# Patient Record
Sex: Female | Born: 1980
Health system: Southern US, Community
[De-identification: ages and names within clinical notes are randomized; demographics above are authoritative.]

## PROBLEM LIST (undated history)

## (undated) ENCOUNTER — Inpatient Hospital Stay (HOSPITAL_COMMUNITY): Payer: Self-pay

## (undated) DIAGNOSIS — J45909 Unspecified asthma, uncomplicated: Secondary | ICD-10-CM

## (undated) DIAGNOSIS — S72009A Fracture of unspecified part of neck of unspecified femur, initial encounter for closed fracture: Secondary | ICD-10-CM

## (undated) DIAGNOSIS — S2239XA Fracture of one rib, unspecified side, initial encounter for closed fracture: Secondary | ICD-10-CM

## (undated) DIAGNOSIS — S42309A Unspecified fracture of shaft of humerus, unspecified arm, initial encounter for closed fracture: Secondary | ICD-10-CM

## (undated) DIAGNOSIS — F909 Attention-deficit hyperactivity disorder, unspecified type: Secondary | ICD-10-CM

## (undated) DIAGNOSIS — R87629 Unspecified abnormal cytological findings in specimens from vagina: Secondary | ICD-10-CM

## (undated) DIAGNOSIS — O0930 Supervision of pregnancy with insufficient antenatal care, unspecified trimester: Secondary | ICD-10-CM

## (undated) DIAGNOSIS — S2249XA Multiple fractures of ribs, unspecified side, initial encounter for closed fracture: Secondary | ICD-10-CM

## (undated) HISTORY — PX: STYLOID PROCESS EXCISION: SHX5198

## (undated) HISTORY — DX: Unspecified fracture of shaft of humerus, unspecified arm, initial encounter for closed fracture: S42.309A

## (undated) HISTORY — DX: Supervision of pregnancy with insufficient antenatal care, unspecified trimester: O09.30

## (undated) HISTORY — DX: Unspecified abnormal cytological findings in specimens from vagina: R87.629

## (undated) HISTORY — PX: CHEST TUBE INSERTION: SHX231

---
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
## 2003-12-22 ENCOUNTER — Other Ambulatory Visit: Admission: RE | Admit: 2003-12-22 | Discharge: 2003-12-22 | Payer: Self-pay | Admitting: Obstetrics and Gynecology

## 2004-03-30 ENCOUNTER — Ambulatory Visit (HOSPITAL_COMMUNITY): Admission: RE | Admit: 2004-03-30 | Discharge: 2004-03-30 | Payer: Self-pay | Admitting: Obstetrics and Gynecology

## 2004-04-01 ENCOUNTER — Inpatient Hospital Stay (HOSPITAL_COMMUNITY): Admission: AC | Admit: 2004-04-01 | Discharge: 2004-04-14 | Payer: Self-pay

## 2004-07-05 ENCOUNTER — Inpatient Hospital Stay (HOSPITAL_COMMUNITY): Admission: AD | Admit: 2004-07-05 | Discharge: 2004-07-05 | Payer: Self-pay | Admitting: Obstetrics & Gynecology

## 2004-07-15 ENCOUNTER — Inpatient Hospital Stay (HOSPITAL_COMMUNITY): Admission: AD | Admit: 2004-07-15 | Discharge: 2004-07-15 | Payer: Self-pay | Admitting: Obstetrics and Gynecology

## 2004-08-19 ENCOUNTER — Inpatient Hospital Stay (HOSPITAL_COMMUNITY): Admission: AD | Admit: 2004-08-19 | Discharge: 2004-08-21 | Payer: Self-pay | Admitting: Obstetrics and Gynecology

## 2004-09-23 ENCOUNTER — Other Ambulatory Visit: Admission: RE | Admit: 2004-09-23 | Discharge: 2004-09-23 | Payer: Self-pay | Admitting: Obstetrics and Gynecology

## 2007-10-12 ENCOUNTER — Inpatient Hospital Stay (HOSPITAL_COMMUNITY): Admission: AD | Admit: 2007-10-12 | Discharge: 2007-10-12 | Payer: Self-pay | Admitting: Obstetrics and Gynecology

## 2008-03-27 ENCOUNTER — Inpatient Hospital Stay (HOSPITAL_COMMUNITY): Admission: AD | Admit: 2008-03-27 | Discharge: 2008-03-27 | Payer: Self-pay | Admitting: Obstetrics and Gynecology

## 2008-06-06 ENCOUNTER — Inpatient Hospital Stay (HOSPITAL_COMMUNITY): Admission: AD | Admit: 2008-06-06 | Discharge: 2008-06-08 | Payer: Self-pay | Admitting: Obstetrics and Gynecology

## 2009-01-19 ENCOUNTER — Emergency Department (HOSPITAL_COMMUNITY): Admission: EM | Admit: 2009-01-19 | Discharge: 2009-01-19 | Payer: Self-pay | Admitting: Emergency Medicine

## 2009-02-26 ENCOUNTER — Emergency Department (HOSPITAL_COMMUNITY): Admission: EM | Admit: 2009-02-26 | Discharge: 2009-02-26 | Payer: Self-pay | Admitting: Emergency Medicine

## 2009-09-13 ENCOUNTER — Emergency Department (HOSPITAL_BASED_OUTPATIENT_CLINIC_OR_DEPARTMENT_OTHER): Admission: EM | Admit: 2009-09-13 | Discharge: 2009-09-13 | Payer: Self-pay | Admitting: Emergency Medicine

## 2009-10-02 ENCOUNTER — Emergency Department (HOSPITAL_BASED_OUTPATIENT_CLINIC_OR_DEPARTMENT_OTHER): Admission: EM | Admit: 2009-10-02 | Discharge: 2009-10-03 | Payer: Self-pay | Admitting: Emergency Medicine
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
## 2011-07-09 ENCOUNTER — Emergency Department (INDEPENDENT_AMBULATORY_CARE_PROVIDER_SITE_OTHER): Payer: Self-pay

## 2011-07-09 ENCOUNTER — Emergency Department (HOSPITAL_BASED_OUTPATIENT_CLINIC_OR_DEPARTMENT_OTHER)
Admission: EM | Admit: 2011-07-09 | Discharge: 2011-07-09 | Disposition: A | Discharge status: Home / Self Care | Payer: Self-pay | Attending: Emergency Medicine | Admitting: Emergency Medicine

## 2011-07-09 ENCOUNTER — Encounter: Payer: Self-pay | Admitting: Emergency Medicine

## 2011-07-09 DIAGNOSIS — R0989 Other specified symptoms and signs involving the circulatory and respiratory systems: Secondary | ICD-10-CM | POA: Insufficient documentation

## 2011-07-09 DIAGNOSIS — IMO0001 Reserved for inherently not codable concepts without codable children: Secondary | ICD-10-CM

## 2011-07-09 DIAGNOSIS — R0609 Other forms of dyspnea: Secondary | ICD-10-CM | POA: Insufficient documentation

## 2011-07-09 DIAGNOSIS — R0602 Shortness of breath: Secondary | ICD-10-CM | POA: Insufficient documentation

## 2011-07-09 DIAGNOSIS — R0789 Other chest pain: Secondary | ICD-10-CM

## 2011-07-09 DIAGNOSIS — R05 Cough: Secondary | ICD-10-CM

## 2011-07-09 DIAGNOSIS — R059 Cough, unspecified: Secondary | ICD-10-CM

## 2011-07-09 NOTE — ED Notes (Signed)
Patient is resting comfortably. No needs voiced.

## 2011-07-09 NOTE — ED Provider Notes (Signed)
History     Chief Complaint  Patient presents with  . Chest Pain   Patient is a 30 y.o. female presenting with shortness of breath. The history is provided by the patient.  Shortness of Breath  The current episode started yesterday. The problem has been unchanged. The problem is mild. Associated symptoms include shortness of breath. Pertinent negatives include no fever, no sore throat, no cough and no wheezing. Associated symptoms comments: She complains of feeling as if she can't take a deep breath. No cough, fever. She feels this when she lies down, and denies having symptoms with exertion. Chest discomfort only when trying to take the deepest breath, otherwise, no chest pain. She quit smoking one month ago. Marland Kitchen She was not exposed to toxic fumes. She has not inhaled smoke recently. She has had no prior steroid use.    History reviewed. No pertinent past medical history.  Past Surgical History  Procedure Date  . Chest tube insertion     No family history on file.  History  Substance Use Topics  . Smoking status: Current Some Day Smoker  . Smokeless tobacco: Not on file  . Alcohol Use: Yes     occ    OB History    Grav Para Term Preterm Abortions TAB SAB Ect Mult Living                  Review of Systems  Constitutional: Negative for fever.  HENT: Negative.  Negative for sore throat.   Respiratory: Positive for shortness of breath. Negative for cough and wheezing.   Cardiovascular:       See HPI.  Musculoskeletal: Negative.   Neurological: Negative.     Physical Exam  BP 125/83  Pulse 83  Temp(Src) 98 F (36.7 C) (Oral)  Resp 20  SpO2 99%  LMP 06/25/2011  Physical Exam  Constitutional: She appears well-developed and well-nourished.  HENT:  Head: Normocephalic.  Neck: Normal range of motion. Neck supple.  Cardiovascular: Normal rate, regular rhythm and normal heart sounds.   Pulmonary/Chest: Effort normal and breath sounds normal.  Abdominal: Soft. Bowel  sounds are normal. There is no tenderness. There is no rebound and no guarding.  Musculoskeletal: Normal range of motion.  Neurological: She is alert. No cranial nerve deficit.  Skin: Skin is warm and dry. No rash noted.  Psychiatric: She has a normal mood and affect.    ED Course  Procedures Chest xray reviewed and is essentially normal. Discussed results with patient, who is comfortable with discharge.  MDM       Rodena Medin, PA 07/09/11 1758

## 2011-07-09 NOTE — ED Notes (Signed)
No PCP 

## 2011-09-04 NOTE — ED Provider Notes (Signed)
Medical screening examination/treatment/procedure(s) were performed by non-physician practitioner and as supervising physician I was immediately available for consultation/collaboration.  Doug Sou, MD 09/04/11 [phone]

## 2011-09-20 LAB — CBC
HCT: 33.2 — ABNORMAL LOW
Hemoglobin: 11.6 — ABNORMAL LOW
MCV: 85.7
Platelets: 218
RBC: 3.88
RDW: 13.9
WBC: 8.1

## 2011-09-20 LAB — RH IMMUNE GLOBULIN WORKUP (NOT WOMEN'S HOSP)

## 2011-09-20 LAB — RPR: RPR Ser Ql: NONREACTIVE

## 2011-09-22 LAB — RH IMMUNE GLOB WKUP(>/=20WKS)(NOT WOMEN'S HOSP)

## 2011-09-22 LAB — CBC
HCT: 30.8 — ABNORMAL LOW
Hemoglobin: 10.6 — ABNORMAL LOW
MCHC: 35.1
MCV: 84.6
Platelets: 180
RDW: 13.8
WBC: 10.5

## 2011-10-05 LAB — RH IMMUNE GLOBULIN WORKUP (NOT WOMEN'S HOSP)

## 2013-01-09 ENCOUNTER — Telehealth: Payer: Self-pay | Admitting: *Deleted

## 2013-01-09 NOTE — Telephone Encounter (Signed)
Pharmacy requesting refill on proair inhaler. Last fill on 11/28/11. Chart is at nurses station for review

## 2013-01-10 NOTE — Telephone Encounter (Signed)
We have not seen the patient in a year an OV is necessary.

## 2013-01-10 NOTE — Telephone Encounter (Signed)
Called patient to advise she will need office visit.

## 2016-06-19 DIAGNOSIS — J029 Acute pharyngitis, unspecified: Secondary | ICD-10-CM | POA: Diagnosis not present

## 2016-12-23 ENCOUNTER — Inpatient Hospital Stay (HOSPITAL_COMMUNITY): Payer: Medicaid Other

## 2016-12-23 ENCOUNTER — Inpatient Hospital Stay (HOSPITAL_COMMUNITY)
Admission: AD | Admit: 2016-12-23 | Discharge: 2016-12-23 | Disposition: A | Discharge status: Home / Self Care | Payer: Medicaid Other | Source: Ambulatory Visit | Attending: Obstetrics and Gynecology | Admitting: Obstetrics and Gynecology

## 2016-12-23 ENCOUNTER — Encounter (HOSPITAL_COMMUNITY): Payer: Self-pay | Admitting: *Deleted

## 2016-12-23 DIAGNOSIS — Z3A11 11 weeks gestation of pregnancy: Secondary | ICD-10-CM | POA: Diagnosis not present

## 2016-12-23 DIAGNOSIS — O208 Other hemorrhage in early pregnancy: Secondary | ICD-10-CM | POA: Diagnosis not present

## 2016-12-23 DIAGNOSIS — O99342 Other mental disorders complicating pregnancy, second trimester: Secondary | ICD-10-CM | POA: Diagnosis not present

## 2016-12-23 DIAGNOSIS — F17210 Nicotine dependence, cigarettes, uncomplicated: Secondary | ICD-10-CM | POA: Insufficient documentation

## 2016-12-23 DIAGNOSIS — O26892 Other specified pregnancy related conditions, second trimester: Secondary | ICD-10-CM | POA: Diagnosis not present

## 2016-12-23 DIAGNOSIS — N760 Acute vaginitis: Secondary | ICD-10-CM | POA: Diagnosis not present

## 2016-12-23 DIAGNOSIS — B9689 Other specified bacterial agents as the cause of diseases classified elsewhere: Secondary | ICD-10-CM

## 2016-12-23 DIAGNOSIS — O99332 Smoking (tobacco) complicating pregnancy, second trimester: Secondary | ICD-10-CM | POA: Diagnosis not present

## 2016-12-23 DIAGNOSIS — R109 Unspecified abdominal pain: Secondary | ICD-10-CM | POA: Insufficient documentation

## 2016-12-23 DIAGNOSIS — IMO0002 Reserved for concepts with insufficient information to code with codable children: Secondary | ICD-10-CM

## 2016-12-23 DIAGNOSIS — F909 Attention-deficit hyperactivity disorder, unspecified type: Secondary | ICD-10-CM | POA: Insufficient documentation

## 2016-12-23 HISTORY — DX: Fracture of one rib, unspecified side, initial encounter for closed fracture: S22.39XA

## 2016-12-23 HISTORY — DX: Fracture of unspecified part of neck of unspecified femur, initial encounter for closed fracture: S72.009A

## 2016-12-23 HISTORY — DX: Multiple fractures of ribs, unspecified side, initial encounter for closed fracture: S22.49XA

## 2016-12-23 HISTORY — DX: Attention-deficit hyperactivity disorder, unspecified type: F90.9

## 2016-12-23 LAB — WET PREP, GENITAL
SPERM: NONE SEEN
Trich, Wet Prep: NONE SEEN
Yeast Wet Prep HPF POC: NONE SEEN

## 2016-12-23 LAB — URINALYSIS, ROUTINE W REFLEX MICROSCOPIC
BILIRUBIN URINE: NEGATIVE
GLUCOSE, UA: NEGATIVE mg/dL
KETONES UR: NEGATIVE mg/dL
Nitrite: NEGATIVE
PH: 5 (ref 5.0–8.0)
PROTEIN: NEGATIVE mg/dL
Specific Gravity, Urine: 1.013 (ref 1.005–1.030)

## 2016-12-23 LAB — POCT PREGNANCY, URINE: Preg Test, Ur: POSITIVE — AB

## 2016-12-23 MED ORDER — METRONIDAZOLE 500 MG PO TABS: 500.0000 mg | ORAL_TABLET | Freq: Two times a day (BID) | ORAL | 0 refills | Status: DC

## 2016-12-23 NOTE — MAU Note (Signed)
Patient states she is [redacted] weeks pregnant and had a confirmation US [facility] around 7wks.  Has not been seen by OBGYN yet, but has appt for 01/04/16.  Presents today with lower abdominal cramping 5/10 and green foul smelling discharge.  Denies vaginal bleeding.

## 2016-12-23 NOTE — Progress Notes (Signed)
E Signature page not working in room 4.  Patient signed paper copy.

## 2016-12-23 NOTE — Discharge Instructions (Signed)
Bacterial Vaginosis Bacterial vaginosis is a vaginal infection that occurs when the normal balance of bacteria in the vagina is disrupted. It results from an overgrowth of certain bacteria. This is the most common vaginal infection among women ages 15-44. Because bacterial vaginosis increases your risk for STIs (sexually transmitted infections), getting treated can help reduce your risk for chlamydia, gonorrhea, herpes, and HIV (human immunodeficiency virus). Treatment is also important for preventing complications in pregnant women, because this condition can cause an early (premature) delivery. What are the causes? This condition is caused by an increase in harmful bacteria that are normally present in small amounts in the vagina. However, the reason that the condition develops is not fully understood. What increases the risk? The following factors may make you more likely to develop this condition:  Having a new sexual partner or multiple sexual partners.  Having unprotected sex.  Douching.  Having an intrauterine device (IUD).  Smoking.  Drug and alcohol abuse.  Taking certain antibiotic medicines.  Being pregnant.  You cannot get bacterial vaginosis from toilet seats, bedding, swimming pools, or contact with objects around you. What are the signs or symptoms? Symptoms of this condition include:  Grey or white vaginal discharge. The discharge can also be watery or foamy.  A fish-like odor with discharge, especially after sexual intercourse or during menstruation.  Itching in and around the vagina.  Burning or pain with urination.  Some women with bacterial vaginosis have no signs or symptoms. How is this diagnosed? This condition is diagnosed based on:  Your medical history.  A physical exam of the vagina.  Testing a sample of vaginal fluid under a microscope to look for a large amount of bad bacteria or abnormal cells. Your health care provider may use a cotton swab  or a small wooden spatula to collect the sample.  How is this treated? This condition is treated with antibiotics. These may be given as a pill, a vaginal cream, or a medicine that is put into the vagina (suppository). If the condition comes back after treatment, a second round of antibiotics may be needed. Follow these instructions at home: Medicines  Take over-the-counter and prescription medicines only as told by your health care provider.  Take or use your antibiotic as told by your health care provider. Do not stop taking or using the antibiotic even if you start to feel better. General instructions  If you have a female sexual partner, tell her that you have a vaginal infection. She should see her health care provider and be treated if she has symptoms. If you have a female sexual partner, he does not need treatment.  During treatment: ? Avoid sexual activity until you finish treatment. ? Do not douche. ? Avoid alcohol as directed by your health care provider. ? Avoid breastfeeding as directed by your health care provider.  Drink enough water and fluids to keep your urine clear or pale yellow.  Keep the area around your vagina and rectum clean. ? Wash the area daily with warm water. ? Wipe yourself from front to back after using the toilet.  Keep all follow-up visits as told by your health care provider. This is important. How is this prevented?  Do not douche.  Wash the outside of your vagina with warm water only.  Use protection when having sex. This includes latex condoms and dental dams.  Limit how many sexual partners you have. To help prevent bacterial vaginosis, it is best to have sex with just   one partner (monogamous).  Make sure you and your sexual partner are tested for STIs.  Wear cotton or cotton-lined underwear.  Avoid wearing tight pants and pantyhose, especially during summer.  Limit the amount of alcohol that you drink.  Do not use any products that  contain nicotine or tobacco, such as cigarettes and e-cigarettes. If you need help quitting, ask your health care provider.  Do not use illegal drugs. Where to find more information:  Centers for Disease Control and Prevention: www.cdc.gov/std  American Sexual Health Association (ASHA): www.ashastd.org  U.S. Department of Health and Human Services, Office on Women's Health: www.womenshealth.gov/ or https://www.womenshealth.gov/a-z-topics/bacterial-vaginosis Contact a health care provider if:  Your symptoms do not improve, even after treatment.  You have more discharge or pain when urinating.  You have a fever.  You have pain in your abdomen.  You have pain during sex.  You have vaginal bleeding between periods. Summary  Bacterial vaginosis is a vaginal infection that occurs when the normal balance of bacteria in the vagina is disrupted.  Because bacterial vaginosis increases your risk for STIs (sexually transmitted infections), getting treated can help reduce your risk for chlamydia, gonorrhea, herpes, and HIV (human immunodeficiency virus). Treatment is also important for preventing complications in pregnant women, because the condition can cause an early (premature) delivery.  This condition is treated with antibiotic medicines. These may be given as a pill, a vaginal cream, or a medicine that is put into the vagina (suppository). This information is not intended to replace advice given to you by your health care provider. Make sure you discuss any questions you have with your health care provider. Document Released: 12/12/2005 Document Revised: 08/27/2016 Document Reviewed: 08/27/2016 Elsevier Interactive Patient Education  2017 [employer]  

## 2016-12-23 NOTE — MAU Provider Note (Signed)
History     CSN: [id number]  Arrival date and time: 12/23/16 1603   First Provider Initiated Contact with Patient 12/23/16 1648      Chief Complaint  Patient presents with  . Abdominal Pain  . Vaginal Discharge   HPI Kim Freeman is a 35 y.o. G3P2002 at 68101w5d who presents with vaginal discharge & abdominal cramping. IUP confirmed by ultrasound at 7 weeks at the [facility]. [facility].  Current symptoms began 3 days ago. Reports intermittent lower abdominal cramping that she rates 5/10. Has not treated. Vaginal discharge varies in color & consistency; yellow/green & thin/thick. Malodorous. No vaginal irritation. Denies n/v/d, constipation, dysuria, vaginal bleeding, dyspareunia, or postcoital bleeding.   OB History    Gravida Para Term Preterm AB Living   3 2 2  0 0 2   SAB TAB Ectopic Multiple Live Births   0 0 0 0 2      Past Medical History:  Diagnosis Date  . ADHD   . Broken ribs   . Fracture, hip [facility](HCC)     Past Surgical History:  Procedure Laterality Date  . CHEST TUBE INSERTION    . NO PAST SURGERIES      History reviewed. No pertinent family history.  Social History  Substance Use Topics  . Smoking status: Current Some Day Smoker    Packs/day: 0.25  . Smokeless tobacco: Never Used  . Alcohol use No     Comment: occ    Allergies: No Known Allergies  Prescriptions Prior to Admission  Medication Sig Dispense Refill Last Dose  . amphetamine-dextroamphetamine (ADDERALL) 30 MG tablet Take 20-30 mg by mouth daily.   12/23/2016 at Unknown time    Review of Systems  Constitutional: Negative.   Gastrointestinal: Positive for abdominal pain. Negative for constipation, nausea and vomiting.  Genitourinary: Negative for dysuria.       + vaginal discharge No vaginal bleeding   Physical Exam   Blood pressure 131/81, pulse 101, temperature 97.9 F (36.6 C), temperature source Oral, resp. rate 18, height 5'  3" (1.6 m), weight 184 lb 6.4 oz (83.6 kg), last menstrual period 10/02/2016, SpO2 97 %.  Physical Exam  Nursing note and vitals reviewed. Constitutional: She is oriented to person, place, and time. She appears well-developed and well-nourished. No distress.  HENT:  Head: Normocephalic and atraumatic.  Eyes: Conjunctivae are normal. Right eye exhibits no discharge. Left eye exhibits no discharge. No scleral icterus.  Neck: Normal range of motion.  Cardiovascular: Normal rate, regular rhythm and normal heart sounds.   No murmur heard. Respiratory: Effort normal and breath sounds normal. No respiratory distress. She has no wheezes.  GI: Soft. Bowel sounds are normal. She exhibits no distension. There is no tenderness. There is no rebound and no guarding.  Genitourinary: Uterus is enlarged. Cervix exhibits discharge (small amount of clear frothy discharge). Cervix exhibits no motion tenderness and no friability. Right adnexum displays no mass and no tenderness. Left adnexum displays no mass and no tenderness. No bleeding in the vagina. Vaginal discharge found.  Neurological: She is alert and oriented to person, place, and time.  Skin: Skin is warm and dry. She is not diaphoretic.  Psychiatric: She has a normal mood and affect. Her behavior is normal. Judgment and thought content normal.    MAU Course  Procedures Results for orders placed or performed during the hospital encounter of 12/23/16 (from the past 24 hour(s))  Urinalysis, Routine  w reflex microscopic     Status: Abnormal   Collection Time: 12/23/16  4:26 PM  Result Value Ref Range   Color, Urine YELLOW YELLOW   APPearance CLOUDY (A) CLEAR   Specific Gravity, Urine 1.013 1.005 - 1.030   pH 5.0 5.0 - 8.0   Glucose, UA NEGATIVE NEGATIVE mg/dL   Hgb urine dipstick SMALL (A) NEGATIVE   Bilirubin Urine NEGATIVE NEGATIVE   Ketones, ur NEGATIVE NEGATIVE mg/dL   Protein, ur NEGATIVE NEGATIVE mg/dL   Nitrite NEGATIVE NEGATIVE    Leukocytes, UA LARGE (A) NEGATIVE   RBC / HPF 6-30 0 - 5 RBC/hpf   WBC, UA 6-30 0 - 5 WBC/hpf   Bacteria, UA RARE (A) NONE SEEN   Squamous Epithelial / LPF 6-30 (A) NONE SEEN   Mucous PRESENT   Pregnancy, urine POC     Status: Abnormal   Collection Time: 12/23/16  4:28 PM  Result Value Ref Range   Preg Test, Ur POSITIVE (A) NEGATIVE  Wet prep, genital     Status: Abnormal   Collection Time: 12/23/16  5:03 PM  Result Value Ref Range   Yeast Wet Prep HPF POC NONE SEEN NONE SEEN   Trich, Wet Prep NONE SEEN NONE SEEN   Clue Cells Wet Prep HPF POC PRESENT (A) NONE SEEN   WBC, Wet Prep HPF POC MANY (A) NONE SEEN   Sperm NONE SEEN    [place] Ob Comp Less 14 Wks  Result Date: 12/23/2016 CLINICAL DATA:  Fetal heart tones not heard.  Initial encounter. EXAM: OBSTETRIC <14 WK ULTRASOUND TECHNIQUE: Transabdominal ultrasound was performed for evaluation of the gestation as well as the maternal uterus and adnexal regions. COMPARISON:  Pelvic ultrasound performed 07/15/2004 FINDINGS: Intrauterine gestational sac: Single; visualized and normal in shape. Yolk sac:  No Embryo:  Yes Cardiac Activity: Yes Heart Rate: 170 bpm CRL:   50.5  mm   11 w 5 d                  US EDC: 07/09/2017 Subchorionic hemorrhage: A small amount of subchorionic hemorrhage is noted. Maternal uterus/adnexae: The uterus is otherwise unremarkable. The right ovary is unremarkable in appearance, measuring 3.6 x 2.2 x 2.6 cm. The left ovary is not visualized due to overlying bowel. There is no evidence for ovarian torsion. No free fluid is seen within the pelvic cul-de-sac. IMPRESSION: 1. Single live intrauterine pregnancy noted, with a crown-rump length of 5.1 cm, corresponding to a gestational age of [redacted] weeks 5 days. This reflects an estimated date of delivery of July 09, 2017. 2. Small amount of subchorionic hemorrhage noted. Electronically Signed   By: Roanna RaiderJeffery  Chang M.D.   On: 12/23/2016 18:08    MDM Unable to doppler FHTs -- ultrasound  ordered GC/CT & wet prep Ultrasound shows SIUP at 4033w5d with cardiac activity  Assessment and Plan  A: 1. BV (bacterial vaginosis)   2. Fetal heart tones not heard    P; Discharge home Rx flagyl GC/CT & urine culture pending Start prenatal care Discussed reasons to return to MAU  Judeth HornErin Shauntell Iglesia 12/23/2016, 4:48 PM

## 2016-12-24 LAB — CULTURE, OB URINE

## 2016-12-26 NOTE — L&D Delivery Note (Signed)
Delivery Note At 10:32 PM a viable female was delivered via Vaginal, Spontaneous Delivery (Presentation: OA;  ).  APGAR: 8, 9; weight pending .   Placenta status:delivered intact, shultz , .  Cord: 3vc  with the following complications:loose nuchal x 1 .  Cord pH: none  Anesthesia: Epidural  Episiotomy: None Lacerations:  none Suture Repair: n/a Est. Blood Loss (mL):  100ml  Mom to postpartum.  Baby to Couplet care / Skin to Skin  Pt desires postpartum tubal.  Consent signed 05/04/17  Kim Freeman 07/03/2017, 10:46 PM

## 2016-12-27 LAB — GC/CHLAMYDIA PROBE AMP (~~LOC~~) NOT AT ARMC
Chlamydia: NEGATIVE
Neisseria Gonorrhea: NEGATIVE

## 2016-12-29 ENCOUNTER — Telehealth: Payer: Self-pay

## 2016-12-29 NOTE — Telephone Encounter (Signed)
Pt called and stated that she was returning our call.  Called pt and informed her of her nornal STD screening and that at this time her urine shows nothing to treat.  Pt stated understanding with no further questions.

## 2017-02-02 ENCOUNTER — Inpatient Hospital Stay (HOSPITAL_COMMUNITY)
Admission: AD | Admit: 2017-02-02 | Discharge: 2017-02-02 | Disposition: A | Discharge status: Home / Self Care | Payer: Medicaid Other | Source: Ambulatory Visit | Attending: Obstetrics and Gynecology | Admitting: Obstetrics and Gynecology

## 2017-02-02 ENCOUNTER — Encounter (HOSPITAL_COMMUNITY): Payer: Self-pay | Admitting: *Deleted

## 2017-02-02 DIAGNOSIS — O368120 Decreased fetal movements, second trimester, not applicable or unspecified: Secondary | ICD-10-CM | POA: Diagnosis not present

## 2017-02-02 DIAGNOSIS — Z3A17 17 weeks gestation of pregnancy: Secondary | ICD-10-CM | POA: Diagnosis not present

## 2017-02-02 DIAGNOSIS — O368121 Decreased fetal movements, second trimester, fetus 1: Secondary | ICD-10-CM | POA: Diagnosis not present

## 2017-02-02 LAB — URINALYSIS, ROUTINE W REFLEX MICROSCOPIC
Bilirubin Urine: NEGATIVE
GLUCOSE, UA: NEGATIVE mg/dL
HGB URINE DIPSTICK: NEGATIVE
Ketones, ur: NEGATIVE mg/dL
NITRITE: NEGATIVE
PH: 7 (ref 5.0–8.0)
PROTEIN: NEGATIVE mg/dL
Specific Gravity, Urine: 1.023 (ref 1.005–1.030)

## 2017-02-02 MED ORDER — CONCEPT OB 130-92.4-1 MG PO CAPS: 1.0000 | ORAL_CAPSULE | Freq: Every day | ORAL | 12 refills | Status: DC

## 2017-02-02 NOTE — MAU Note (Signed)
States that she had been feeling fetal movement but not today; has had one prenatal visit; had originally been planning termination;

## 2017-02-02 NOTE — Discharge Instructions (Signed)

## 2017-02-02 NOTE — MAU Provider Note (Signed)
Chief Complaint  Patient presents with  . Decreased Fetal Movement     First Provider Initiated Contact with Patient 02/02/17 1155      S: Kim SkeeterShawn M Freeman  is a 36 y.o. y.o. year old 403P2002 female at 6883w4d weeks gestation who presents to MAU reporting decreased fetal movement since today. Has previously been feeling fetal mvmt daily. Pt is also very emotional because she had been planning termination and changed hr mind. Is worried that she has not gotten prenatal care or genetic screening.    Contractions: None Vaginal bleeding: None Leaking of fluid: None   O: Patient Vitals for the past 24 hrs:  BP Temp Temp src Pulse Resp  02/02/17 1144 127/82 98.1 F (36.7 C) Oral 96 18   General: NAD Heart: Regular rate Lungs: Normal rate and effort Abd: Soft, NT, Gravid, S=D Pelvic: Deferred   FHR 154 by doppler.  Informal BS US shows active fetus w/ grossly nml fluid, pos cardiac activity and BPB 19.0 weeks.   A: 783w4d week IUP Decreased fetal movement w/ Nml fetal mvmt and FHR.   P: Discharge home in stable condition per consult w/ Huel CoteKathy Richardson, MD. Preterm labor precautions and fetal kick counts. Follow-up in 1 week as scheduled for prenatal visit or sooner as needed if symptoms worsen. Return to maternity admissions as needed if symptoms worsen. Ask about quad screen at [place]. Rx PNV.   [place] Le[place] Folds, CNM 02/02/2017 12:40 PM  2

## 2017-02-20 DIAGNOSIS — Z1151 Encounter for screening for human papillomavirus (HPV): Secondary | ICD-10-CM | POA: Diagnosis not present

## 2017-02-20 DIAGNOSIS — O0932 Supervision of pregnancy with insufficient antenatal care, second trimester: Secondary | ICD-10-CM | POA: Diagnosis not present

## 2017-02-20 DIAGNOSIS — Z3A20 20 weeks gestation of pregnancy: Secondary | ICD-10-CM | POA: Diagnosis not present

## 2017-02-20 DIAGNOSIS — Z124 Encounter for screening for malignant neoplasm of cervix: Secondary | ICD-10-CM | POA: Diagnosis not present

## 2017-02-20 DIAGNOSIS — O99332 Smoking (tobacco) complicating pregnancy, second trimester: Secondary | ICD-10-CM | POA: Diagnosis not present

## 2017-02-20 DIAGNOSIS — Z363 Encounter for antenatal screening for malformations: Secondary | ICD-10-CM | POA: Diagnosis not present

## 2017-02-20 DIAGNOSIS — R87612 Low grade squamous intraepithelial lesion on cytologic smear of cervix (LGSIL): Secondary | ICD-10-CM | POA: Diagnosis not present

## 2017-02-20 DIAGNOSIS — Z3689 Encounter for other specified antenatal screening: Secondary | ICD-10-CM | POA: Diagnosis not present

## 2017-02-20 LAB — OB RESULTS CONSOLE ABO/RH: RH Type: NEGATIVE

## 2017-02-20 LAB — OB RESULTS CONSOLE HEPATITIS B SURFACE ANTIGEN: HEP B S AG: NEGATIVE

## 2017-02-20 LAB — OB RESULTS CONSOLE ANTIBODY SCREEN: Antibody Screen: NEGATIVE

## 2017-02-20 LAB — OB RESULTS CONSOLE GC/CHLAMYDIA
CHLAMYDIA, DNA PROBE: NEGATIVE
Gonorrhea: NEGATIVE

## 2017-02-20 LAB — OB RESULTS CONSOLE RPR: RPR: NONREACTIVE

## 2017-02-20 LAB — OB RESULTS CONSOLE HIV ANTIBODY (ROUTINE TESTING): HIV: NONREACTIVE

## 2017-02-20 LAB — OB RESULTS CONSOLE RUBELLA ANTIBODY, IGM: RUBELLA: IMMUNE

## 2017-03-20 DIAGNOSIS — R87612 Low grade squamous intraepithelial lesion on cytologic smear of cervix (LGSIL): Secondary | ICD-10-CM | POA: Diagnosis not present

## 2017-03-20 DIAGNOSIS — O0932 Supervision of pregnancy with insufficient antenatal care, second trimester: Secondary | ICD-10-CM | POA: Diagnosis not present

## 2017-03-20 DIAGNOSIS — Z3A24 24 weeks gestation of pregnancy: Secondary | ICD-10-CM | POA: Diagnosis not present

## 2017-03-20 DIAGNOSIS — N888 Other specified noninflammatory disorders of cervix uteri: Secondary | ICD-10-CM | POA: Diagnosis not present

## 2017-04-17 DIAGNOSIS — Z3689 Encounter for other specified antenatal screening: Secondary | ICD-10-CM | POA: Diagnosis not present

## 2017-04-17 DIAGNOSIS — O0932 Supervision of pregnancy with insufficient antenatal care, second trimester: Secondary | ICD-10-CM | POA: Diagnosis not present

## 2017-04-17 DIAGNOSIS — O99332 Smoking (tobacco) complicating pregnancy, second trimester: Secondary | ICD-10-CM | POA: Diagnosis not present

## 2017-04-17 DIAGNOSIS — Z3A28 28 weeks gestation of pregnancy: Secondary | ICD-10-CM | POA: Diagnosis not present

## 2017-04-17 DIAGNOSIS — Z3483 Encounter for supervision of other normal pregnancy, third trimester: Secondary | ICD-10-CM | POA: Diagnosis not present

## 2017-04-17 DIAGNOSIS — O360130 Maternal care for anti-D [Rh] antibodies, third trimester, not applicable or unspecified: Secondary | ICD-10-CM | POA: Diagnosis not present

## 2017-04-17 DIAGNOSIS — Z6791 Unspecified blood type, Rh negative: Secondary | ICD-10-CM | POA: Diagnosis not present

## 2017-05-23 DIAGNOSIS — O358XX0 Maternal care for other (suspected) fetal abnormality and damage, not applicable or unspecified: Secondary | ICD-10-CM | POA: Diagnosis not present

## 2017-05-23 DIAGNOSIS — Z3A33 33 weeks gestation of pregnancy: Secondary | ICD-10-CM | POA: Diagnosis not present

## 2017-05-29 ENCOUNTER — Encounter (HOSPITAL_COMMUNITY): Payer: Self-pay

## 2017-05-29 ENCOUNTER — Inpatient Hospital Stay (HOSPITAL_COMMUNITY)
Admission: AD | Admit: 2017-05-29 | Discharge: 2017-05-29 | Disposition: A | Discharge status: Home / Self Care | Payer: BLUE CROSS/BLUE SHIELD | Source: Ambulatory Visit | Attending: Obstetrics and Gynecology | Admitting: Obstetrics and Gynecology

## 2017-05-29 DIAGNOSIS — O42913 Preterm premature rupture of membranes, unspecified as to length of time between rupture and onset of labor, third trimester: Secondary | ICD-10-CM | POA: Diagnosis not present

## 2017-05-29 DIAGNOSIS — O479 False labor, unspecified: Secondary | ICD-10-CM | POA: Diagnosis not present

## 2017-05-29 DIAGNOSIS — Z3A34 34 weeks gestation of pregnancy: Secondary | ICD-10-CM | POA: Insufficient documentation

## 2017-05-29 DIAGNOSIS — Z3493 Encounter for supervision of normal pregnancy, unspecified, third trimester: Secondary | ICD-10-CM | POA: Insufficient documentation

## 2017-05-29 DIAGNOSIS — O26893 Other specified pregnancy related conditions, third trimester: Secondary | ICD-10-CM

## 2017-05-29 DIAGNOSIS — N898 Other specified noninflammatory disorders of vagina: Secondary | ICD-10-CM

## 2017-05-29 LAB — URINALYSIS, ROUTINE W REFLEX MICROSCOPIC
BILIRUBIN URINE: NEGATIVE
Glucose, UA: NEGATIVE mg/dL
HGB URINE DIPSTICK: NEGATIVE
KETONES UR: NEGATIVE mg/dL
NITRITE: NEGATIVE
PROTEIN: 100 mg/dL — AB
Specific Gravity, Urine: 1.025 (ref 1.005–1.030)
pH: 8 (ref 5.0–8.0)

## 2017-05-29 LAB — POCT FERN TEST

## 2017-05-29 NOTE — Progress Notes (Addendum)
G3P2@ 34.[redacted] wksga. Presents to triage for R/O SROM. States started leaking two days ago and cont to leak. No ctx or bleeding.   1600: Provider at bs  Assessing. Fern test cone. No pooling of fluid on speculum exam  SVE 1/60/-3  Fern test negative.   1635: Dr. Ellyn HackBovard notified. Orders received to discharge pt home.   Discharge instructions given with pt understanding. Pt left unit via ambulatory.

## 2017-05-29 NOTE — Progress Notes (Signed)
Urine in lab 

## 2017-05-29 NOTE — Discharge Instructions (Signed)

## 2017-05-29 NOTE — MAU Provider Note (Signed)
S: Ms. Kim Freeman is a 36 y.o. G3P2002 at 5537w1d  who presents to MAU today complaining of leaking of fluid since 2 days ago, and felt like her underwear was wet after laying down and then sitting up today. She denies vaginal bleeding. She denies contractions. She reports normal fetal movement.    O: BP 120/68   Pulse 80   Temp 98 F (36.7 C) (Oral)   Resp 18   LMP 10/02/2016 (Exact Date)   SpO2 99%  GENERAL: Well-developed, well-nourished female in no acute distress.  HEAD: Normocephalic, atraumatic.  CHEST: Normal effort of breathing, regular heart rate ABDOMEN: Soft, nontender, gravid PELVIC: Normal external female genitalia. Vagina is pink and rugated. Cervix with normal contour, no lesions. Normal physiologic white discharge.  NO pooling.   Cervical exam: 1cm/60%/posterior/-3  Bag intact on exam.   Fetal Monitoring: I personally reviewed the patient's NST today, found to be REACTIVE. 135 bpm, mod var, +accels, no decels. CTX: None.   Results for orders placed or performed during the hospital encounter of 05/29/17 (from the past 24 hour(s))  Urinalysis, Routine w reflex microscopic     Status: Abnormal   Collection Time: 05/29/17  3:25 PM  Result Value Ref Range   Color, Urine YELLOW YELLOW   APPearance HAZY (A) CLEAR   Specific Gravity, Urine 1.025 1.005 - 1.030   pH 8.0 5.0 - 8.0   Glucose, UA NEGATIVE NEGATIVE mg/dL   Hgb urine dipstick NEGATIVE NEGATIVE   Bilirubin Urine NEGATIVE NEGATIVE   Ketones, ur NEGATIVE NEGATIVE mg/dL   Protein, ur 161100 (A) NEGATIVE mg/dL   Nitrite NEGATIVE NEGATIVE   Leukocytes, UA SMALL (A) NEGATIVE   RBC / HPF 0-5 0 - 5 RBC/hpf   WBC, UA 0-5 0 - 5 WBC/hpf   Bacteria, UA MANY (A) NONE SEEN   Squamous Epithelial / LPF 0-5 (A) NONE SEEN   Mucous PRESENT   Fern Test     Status: Normal   Collection Time: 05/29/17  4:30 PM  Result Value Ref Range   POCT Fern Test       A: SIUP at 6037w1d  Membranes intact  Reactive NST  P: Report  given to RN to contact MD on call for further instructions Dr. Ellyn HackBovard OK with discharge.  Kim Freeman, Kim Freeman [place], [place]  [place] 05/29/2017 4:47 PM

## 2017-05-30 LAB — CULTURE, OB URINE

## 2017-06-06 DIAGNOSIS — Z3685 Encounter for antenatal screening for Streptococcus B: Secondary | ICD-10-CM | POA: Diagnosis not present

## 2017-06-06 DIAGNOSIS — Z3A35 35 weeks gestation of pregnancy: Secondary | ICD-10-CM | POA: Diagnosis not present

## 2017-06-06 DIAGNOSIS — O0933 Supervision of pregnancy with insufficient antenatal care, third trimester: Secondary | ICD-10-CM | POA: Diagnosis not present

## 2017-06-07 LAB — OB RESULTS CONSOLE GBS: GBS: NEGATIVE

## 2017-06-12 ENCOUNTER — Inpatient Hospital Stay (HOSPITAL_COMMUNITY)
Admission: AD | Admit: 2017-06-12 | Discharge: 2017-06-12 | Disposition: A | Discharge status: Home / Self Care | Payer: BLUE CROSS/BLUE SHIELD | Source: Ambulatory Visit | Attending: Obstetrics and Gynecology | Admitting: Obstetrics and Gynecology

## 2017-06-12 ENCOUNTER — Encounter (HOSPITAL_COMMUNITY): Payer: Self-pay

## 2017-06-12 DIAGNOSIS — Z711 Person with feared health complaint in whom no diagnosis is made: Secondary | ICD-10-CM | POA: Insufficient documentation

## 2017-06-12 DIAGNOSIS — O99343 Other mental disorders complicating pregnancy, third trimester: Secondary | ICD-10-CM | POA: Diagnosis not present

## 2017-06-12 DIAGNOSIS — O09893 Supervision of other high risk pregnancies, third trimester: Secondary | ICD-10-CM | POA: Diagnosis not present

## 2017-06-12 DIAGNOSIS — F17210 Nicotine dependence, cigarettes, uncomplicated: Secondary | ICD-10-CM | POA: Diagnosis not present

## 2017-06-12 DIAGNOSIS — O9989 Other specified diseases and conditions complicating pregnancy, childbirth and the puerperium: Secondary | ICD-10-CM | POA: Diagnosis not present

## 2017-06-12 DIAGNOSIS — Z3A36 36 weeks gestation of pregnancy: Secondary | ICD-10-CM | POA: Insufficient documentation

## 2017-06-12 DIAGNOSIS — O99333 Smoking (tobacco) complicating pregnancy, third trimester: Secondary | ICD-10-CM | POA: Insufficient documentation

## 2017-06-12 DIAGNOSIS — F909 Attention-deficit hyperactivity disorder, unspecified type: Secondary | ICD-10-CM | POA: Insufficient documentation

## 2017-06-12 LAB — AMNISURE RUPTURE OF MEMBRANE (ROM) NOT AT ARMC: AMNISURE: NEGATIVE

## 2017-06-12 LAB — URINALYSIS, ROUTINE W REFLEX MICROSCOPIC
BILIRUBIN URINE: NEGATIVE
Glucose, UA: NEGATIVE mg/dL
Hgb urine dipstick: NEGATIVE
KETONES UR: NEGATIVE mg/dL
Nitrite: NEGATIVE
PROTEIN: 30 mg/dL — AB
Specific Gravity, Urine: 1.025 (ref 1.005–1.030)
pH: 7 (ref 5.0–8.0)

## 2017-06-12 NOTE — MAU Provider Note (Signed)
Chief Complaint:  No chief complaint on file.   None     HPI: Kim Freeman is a 36 y.o. G3P2002 at 6444w1d who presents to maternity admissions reporting some new onset small bruises on her arm and leg, swelling of her feet and ankles, and recent shortness of breath.  She reports that her friend had preeclampsia and one of the signs of her platelets being low was bruising.  She was a the pool all day and noticed her feet/legs were more swollen than usual. The swelling has resolved now that her feet are up on the bed.  She reports a gradual increase in shortness of breath in the last couple of weeks.  She has not tried any treatments for her symptoms.  She has one associated symptom, which is leakage of clear fluid that occurred 2 days ago after sex. She denies leakage now. She reports good fetal movement, denies LOF, vaginal bleeding, vaginal itching/burning, urinary symptoms, h/a, dizziness, n/v, or fever/chills.    HPI  Past Medical History: Past Medical History:  Diagnosis Date  . ADHD   . Broken ribs   . Fracture, hip (HCC)     Past obstetric history: OB History  Gravida Para Term Preterm AB Living  3 2 2  0 0 2  SAB TAB Ectopic Multiple Live Births  0 0 0 0 2    # Outcome Date GA Lbr Len/2nd Weight Sex Delivery Anes PTL Lv  3 Current           2 Term      Vag-Spont     1 Term      Vag-Spont         Past Surgical History: Past Surgical History:  Procedure Laterality Date  . CHEST TUBE INSERTION    . STYLOID PROCESS EXCISION      Family History: Family History  Problem Relation Age of Onset  . Hypertension Mother   . Cancer Mother   . Hyperlipidemia Father   . Hypertension Father   . Drug abuse Father   . Bipolar disorder Father   . Depression Father   . Cancer Father   . Hypertension Maternal Grandmother   . Drug abuse Maternal Grandmother   . Hyperlipidemia Maternal Grandfather   . Hypertension Maternal Grandfather   . Drug abuse Maternal Grandfather   .  Schizophrenia Paternal Grandmother   . Hyperlipidemia Paternal Grandfather   . Hypertension Paternal Grandfather   . Drug abuse Paternal Grandfather   . Cancer Paternal Grandfather     Social History: Social History  Substance Use Topics  . Smoking status: Current Some Day Smoker    Packs/day: 0.50  . Smokeless tobacco: Current User  . Alcohol use Yes     Comment: states she drinks whiskey most days    Allergies: No Known Allergies  Meds:  No prescriptions prior to admission.    ROS:  Review of Systems  Constitutional: Negative for chills, fatigue and fever.  Eyes: Negative for visual disturbance.  Respiratory: Positive for shortness of breath.   Cardiovascular: Negative for chest pain.  Gastrointestinal: Negative for abdominal pain, nausea and vomiting.  Genitourinary: Positive for vaginal discharge. Negative for difficulty urinating, dysuria, flank pain, pelvic pain, vaginal bleeding and vaginal pain.  Neurological: Negative for dizziness and headaches.  Hematological: Bruises/bleeds easily.  Psychiatric/Behavioral: Negative.      I have reviewed patient's Past Medical Hx, Surgical Hx, Family Hx, Social Hx, medications and allergies.   Physical Exam  Patient Vitals for the past 24 hrs:  BP Temp Temp src Pulse Resp SpO2 Height Weight  06/12/17 1935 117/68 98.4 F (36.9 C) Oral 80 18 - - -  06/12/17 1818 103/61 - - 90 - - - -  06/12/17 1817 - - - - - 97 % - -  06/12/17 1758 122/73 98.1 F (36.7 C) Oral 78 18 100 % 5\' 3"  (1.6 m) 209 lb (94.8 kg)   Constitutional: Well-developed, well-nourished female in no acute distress.  Cardiovascular: normal rate Respiratory: normal effort GI: Abd soft, non-tender, gravid appropriate for gestational age.  MS: Extremities nontender, no edema, normal ROM Neurologic: Alert and oriented x 4.  GU: Neg CVAT. Skin: Small, 0.5 cm bruise/broken capillaries x 2, on the left inner upper arm and right calf    FHT:  Baseline 135 ,  moderate variability, accelerations present, no decelerations Contractions: None on toco or to palpation   Labs: Results for orders placed or performed during the hospital encounter of 06/12/17 (from the past 24 hour(s))  Urinalysis, Routine w reflex microscopic     Status: Abnormal   Collection Time: 06/12/17  6:00 PM  Result Value Ref Range   Color, Urine AMBER (A) YELLOW   APPearance CLOUDY (A) CLEAR   Specific Gravity, Urine 1.025 1.005 - 1.030   pH 7.0 5.0 - 8.0   Glucose, UA NEGATIVE NEGATIVE mg/dL   Hgb urine dipstick NEGATIVE NEGATIVE   Bilirubin Urine NEGATIVE NEGATIVE   Ketones, ur NEGATIVE NEGATIVE mg/dL   Protein, ur 30 (A) NEGATIVE mg/dL   Nitrite NEGATIVE NEGATIVE   Leukocytes, UA SMALL (A) NEGATIVE   RBC / HPF 0-5 0 - 5 RBC/hpf   WBC, UA 0-5 0 - 5 WBC/hpf   Bacteria, UA MANY (A) NONE SEEN   Squamous Epithelial / LPF 6-30 (A) NONE SEEN   Mucous PRESENT   Amnisure rupture of membrane (rom)not at [facility]     Status: None   Collection Time: 06/12/17  6:55 PM  Result Value Ref Range   Amnisure ROM NEGATIVE       Imaging:  No results found.  MAU Course/MDM: I have ordered labs and reviewed results.  NST reviewed and reactive Skin changes wnl for pregnancy, BP wnl so no evidence of preeclampsia.  Edema resolved prior to arrival in MAU. Consult Dr Jackelyn Knife with presentation, exam findings and test results.  Reassurance provided to pt, pt to f/u in office as scheduled Labor precautions reviewed Pt stable at time of discharge.  Assessment: 1. Physically well but worried     Plan: Discharge home Labor precautions and fetal kick counts  Allergies as of 06/12/2017   No Known Allergies     Medication List    TAKE these medications   CONCEPT OB 130-92.4-1 MG Caps Take 1 tablet by mouth daily.   ranitidine 150 MG capsule Commonly known as:  ZANTAC Take 150 mg by mouth daily as needed for heartburn.       Sharen Counter Certified  Nurse-Midwife 06/12/2017 8:36 PM

## 2017-06-12 NOTE — Progress Notes (Addendum)
G3P2 @ 36.[redacted] wksga. Presents to triage for bruises and some lower extremity swelling and SOB. Pt had been at the pool with her two daughters. Denies LOF or bleeding. +FM. EFM applied.   1825: Provider at bs assessing.   1856: Amnisure done.

## 2017-06-12 NOTE — MAU Note (Signed)
Pt reports she has noticed some bruises that have just come up since last pm, increased swelling, and it is getting harder to breathe.

## 2017-06-12 NOTE — Discharge Instructions (Signed)
Third Trimester of Pregnancy The third trimester is from week 28 through week 40 (months 7 through 9). The third trimester is a time when the unborn baby (fetus) is growing rapidly. At the end of the ninth month, the fetus is about 20 inches in length and weighs 6-10 pounds. Body changes during your third trimester Your body will continue to go through many changes during pregnancy. The changes vary from woman to woman. During the third trimester:  Your weight will continue to increase. You can expect to gain 25-35 pounds (11-16 kg) by the end of the pregnancy.  You may begin to get stretch marks on your hips, abdomen, and breasts.  You may urinate more often because the fetus is moving lower into your pelvis and pressing on your bladder.  You may develop or continue to have heartburn. This is caused by increased hormones that slow down muscles in the digestive tract.  You may develop or continue to have constipation because increased hormones slow digestion and cause the muscles that push waste through your intestines to relax.  You may develop hemorrhoids. These are swollen veins (varicose veins) in the rectum that can itch or be painful.  You may develop swollen, bulging veins (varicose veins) in your legs.  You may have increased body aches in the pelvis, back, or thighs. This is due to weight gain and increased hormones that are relaxing your joints.  You may have changes in your hair. These can include thickening of your hair, rapid growth, and changes in texture. Some women also have hair loss during or after pregnancy, or hair that feels dry or thin. Your hair will most likely return to normal after your baby is born.  Your breasts will continue to grow and they will continue to become tender. A yellow fluid (colostrum) may leak from your breasts. This is the first milk you are producing for your baby.  Your belly button may stick out.  You may notice more swelling in your hands,  face, or ankles.  You may have increased tingling or numbness in your hands, arms, and legs. The skin on your belly may also feel numb.  You may feel short of breath because of your expanding uterus.  You may have more problems sleeping. This can be caused by the size of your belly, increased need to urinate, and an increase in your body's metabolism.  You may notice the fetus "dropping," or moving lower in your abdomen (lightening).  You may have increased vaginal discharge.  You may notice your joints feel loose and you may have pain around your pelvic bone.  What to expect at prenatal visits You will have prenatal exams every 2 weeks until week 36. Then you will have weekly prenatal exams. During a routine prenatal visit:  You will be weighed to make sure you and the baby are growing normally.  Your blood pressure will be taken.  Your abdomen will be measured to track your baby's growth.  The fetal heartbeat will be listened to.  Any test results from the previous visit will be discussed.  You may have a cervical check near your due date to see if your cervix has softened or thinned (effaced).  You will be tested for Group B streptococcus. This happens between 35 and 37 weeks.  Your health care provider may ask you:  What your birth plan is.  How you are feeling.  If you are feeling the baby move.  If you have had   any abnormal symptoms, such as leaking fluid, bleeding, severe headaches, or abdominal cramping.  If you are using any tobacco products, including cigarettes, chewing tobacco, and electronic cigarettes.  If you have any questions.  Other tests or screenings that may be performed during your third trimester include:  Blood tests that check for low iron levels (anemia).  Fetal testing to check the health, activity level, and growth of the fetus. Testing is done if you have certain medical conditions or if there are problems during the  pregnancy.  Nonstress test (NST). This test checks the health of your baby to make sure there are no signs of problems, such as the baby not getting enough oxygen. During this test, a belt is placed around your belly. The baby is made to move, and its heart rate is monitored during movement.  What is false labor? False labor is a condition in which you feel small, irregular tightenings of the muscles in the womb (contractions) that usually go away with rest, changing position, or drinking water. These are called Braxton Hicks contractions. Contractions may last for hours, days, or even weeks before true labor sets in. If contractions come at regular intervals, become more frequent, increase in intensity, or become painful, you should see your health care provider. What are the signs of labor?  Abdominal cramps.  Regular contractions that start at 10 minutes apart and become stronger and more frequent with time.  Contractions that start on the top of the uterus and spread down to the lower abdomen and back.  Increased pelvic pressure and dull back pain.  A watery or bloody mucus discharge that comes from the vagina.  Leaking of amniotic fluid. This is also known as your "water breaking." It could be a slow trickle or a gush. Let your health care provider know if it has a color or strange odor. If you have any of these signs, call your health care provider right away, even if it is before your due date. Follow these instructions at home: Medicines  Follow your health care provider's instructions regarding medicine use. Specific medicines may be either safe or unsafe to take during pregnancy.  Take a prenatal vitamin that contains at least 600 micrograms (mcg) of folic acid.  If you develop constipation, try taking a stool softener if your health care provider approves. Eating and drinking  Eat a balanced diet that includes fresh fruits and vegetables, whole grains, good sources of protein  such as meat, eggs, or tofu, and low-fat dairy. Your health care provider will help you determine the amount of weight gain that is right for you.  Avoid raw meat and uncooked cheese. These carry germs that can cause birth defects in the baby.  If you have low calcium intake from food, talk to your health care provider about whether you should take a daily calcium supplement.  Eat four or five small meals rather than three large meals a day.  Limit foods that are high in fat and processed sugars, such as fried and sweet foods.  To prevent constipation: ? Drink enough fluid to keep your urine clear or pale yellow. ? Eat foods that are high in fiber, such as fresh fruits and vegetables, whole grains, and beans. Activity  Exercise only as directed by your health care provider. Most women can continue their usual exercise routine during pregnancy. Try to exercise for 30 minutes at least 5 days a week. Stop exercising if you experience uterine contractions.  Avoid heavy   lifting.  Do not exercise in extreme heat or humidity, or at high altitudes.  Wear low-heel, comfortable shoes.  Practice good posture.  You may continue to have sex unless your health care provider tells you otherwise. Relieving pain and discomfort  Take frequent breaks and rest with your legs elevated if you have leg cramps or low back pain.  Take warm sitz baths to soothe any pain or discomfort caused by hemorrhoids. Use hemorrhoid cream if your health care provider approves.  Wear a good support bra to prevent discomfort from breast tenderness.  If you develop varicose veins: ? Wear support pantyhose or compression stockings as told by your healthcare provider. ? Elevate your feet for 15 minutes, 3-4 times a day. Prenatal care  Write down your questions. Take them to your prenatal visits.  Keep all your prenatal visits as told by your health care provider. This is important. Safety  Wear your seat belt at  all times when driving.  Make a list of emergency phone numbers, including numbers for family, friends, the hospital, and police and fire departments. General instructions  Avoid cat litter boxes and soil used by cats. These carry germs that can cause birth defects in the baby. If you have a cat, ask someone to clean the litter box for you.  Do not travel far distances unless it is absolutely necessary and only with the approval of your health care provider.  Do not use hot tubs, steam rooms, or saunas.  Do not drink alcohol.  Do not use any products that contain nicotine or tobacco, such as cigarettes and e-cigarettes. If you need help quitting, ask your health care provider.  Do not use any medicinal herbs or unprescribed drugs. These chemicals affect the formation and growth of the baby.  Do not douche or use tampons or scented sanitary pads.  Do not cross your legs for long periods of time.  To prepare for the arrival of your baby: ? Take prenatal classes to understand, practice, and ask questions about labor and delivery. ? Make a trial run to the hospital. ? Visit the hospital and tour the maternity area. ? Arrange for maternity or paternity leave through employers. ? Arrange for family and friends to take care of pets while you are in the hospital. ? Purchase a rear-facing car seat and make sure you know how to install it in your car. ? Pack your hospital bag. ? Prepare the baby's nursery. Make sure to remove all pillows and stuffed animals from the baby's crib to prevent suffocation.  Visit your dentist if you have not gone during your pregnancy. Use a soft toothbrush to brush your teeth and be gentle when you floss. Contact a health care provider if:  You are unsure if you are in labor or if your water has broken.  You become dizzy.  You have mild pelvic cramps, pelvic pressure, or nagging pain in your abdominal area.  You have lower back pain.  You have persistent  nausea, vomiting, or diarrhea.  You have an unusual or bad smelling vaginal discharge.  You have pain when you urinate. Get help right away if:  Your water breaks before 37 weeks.  You have regular contractions less than 5 minutes apart before 37 weeks.  You have a fever.  You are leaking fluid from your vagina.  You have spotting or bleeding from your vagina.  You have severe abdominal pain or cramping.  You have rapid weight loss or weight gain.    You have shortness of breath with chest pain.  You notice sudden or extreme swelling of your face, hands, ankles, feet, or legs.  Your baby makes fewer than 10 movements in 2 hours.  You have severe headaches that do not go away when you take medicine.  You have vision changes. Summary  The third trimester is from week 28 through week 40, months 7 through 9. The third trimester is a time when the unborn baby (fetus) is growing rapidly.  During the third trimester, your discomfort may increase as you and your baby continue to gain weight. You may have abdominal, leg, and back pain, sleeping problems, and an increased need to urinate.  During the third trimester your breasts will keep growing and they will continue to become tender. A yellow fluid (colostrum) may leak from your breasts. This is the first milk you are producing for your baby.  False labor is a condition in which you feel small, irregular tightenings of the muscles in the womb (contractions) that eventually go away. These are called Braxton Hicks contractions. Contractions may last for hours, days, or even weeks before true labor sets in.  Signs of labor can include: abdominal cramps; regular contractions that start at 10 minutes apart and become stronger and more frequent with time; watery or bloody mucus discharge that comes from the vagina; increased pelvic pressure and dull back pain; and leaking of amniotic fluid. This information is not intended to replace advice  given to you by your health care provider. Make sure you discuss any questions you have with your health care provider. Document Released: 12/06/2001 Document Revised: 05/19/2016 Document Reviewed: 02/12/2013 Elsevier Interactive Patient Education  2017 [employer]  

## 2017-06-27 ENCOUNTER — Telehealth (HOSPITAL_COMMUNITY): Payer: Self-pay | Admitting: *Deleted

## 2017-06-27 ENCOUNTER — Encounter (HOSPITAL_COMMUNITY): Payer: Self-pay | Admitting: *Deleted

## 2017-06-27 NOTE — Telephone Encounter (Signed)
Preadmission screen  

## 2017-07-03 ENCOUNTER — Inpatient Hospital Stay (HOSPITAL_COMMUNITY)
Admission: RE | Admit: 2017-07-03 | Discharge: 2017-07-05 | DRG: 775 | Disposition: A | Discharge status: Home / Self Care | Payer: BLUE CROSS/BLUE SHIELD | Source: Ambulatory Visit | Attending: Obstetrics and Gynecology | Admitting: Obstetrics and Gynecology

## 2017-07-03 ENCOUNTER — Encounter (HOSPITAL_COMMUNITY): Payer: Self-pay

## 2017-07-03 ENCOUNTER — Inpatient Hospital Stay (HOSPITAL_COMMUNITY): Payer: BLUE CROSS/BLUE SHIELD | Admitting: Anesthesiology

## 2017-07-03 DIAGNOSIS — Z2882 Immunization not carried out because of caregiver refusal: Secondary | ICD-10-CM | POA: Diagnosis not present

## 2017-07-03 DIAGNOSIS — Z3A39 39 weeks gestation of pregnancy: Secondary | ICD-10-CM

## 2017-07-03 DIAGNOSIS — F17210 Nicotine dependence, cigarettes, uncomplicated: Secondary | ICD-10-CM | POA: Diagnosis not present

## 2017-07-03 DIAGNOSIS — O99334 Smoking (tobacco) complicating childbirth: Secondary | ICD-10-CM | POA: Diagnosis not present

## 2017-07-03 DIAGNOSIS — Z3490 Encounter for supervision of normal pregnancy, unspecified, unspecified trimester: Secondary | ICD-10-CM

## 2017-07-03 DIAGNOSIS — Z3493 Encounter for supervision of normal pregnancy, unspecified, third trimester: Secondary | ICD-10-CM | POA: Diagnosis not present

## 2017-07-03 HISTORY — DX: Unspecified asthma, uncomplicated: J45.909

## 2017-07-03 LAB — CBC
HCT: 30.9 % — ABNORMAL LOW (ref 36.0–46.0)
Hemoglobin: 10.7 g/dL — ABNORMAL LOW (ref 12.0–15.0)
MCH: 30.5 pg (ref 26.0–34.0)
MCHC: 34.6 g/dL (ref 30.0–36.0)
MCV: 88 fL (ref 78.0–100.0)
Platelets: 184 10*3/uL (ref 150–400)
RBC: 3.51 MIL/uL — ABNORMAL LOW (ref 3.87–5.11)
RDW: 13.8 % (ref 11.5–15.5)
WBC: 8.5 10*3/uL (ref 4.0–10.5)

## 2017-07-03 LAB — RPR: RPR Ser Ql: NONREACTIVE

## 2017-07-03 MED ORDER — LACTATED RINGERS IV SOLN
500.0000 mL | Freq: Once | INTRAVENOUS | Status: AC
  Administered 2017-07-03: 1000 mL via INTRAVENOUS

## 2017-07-03 MED ORDER — LACTATED RINGERS IV SOLN
INTRAVENOUS | Status: DC
  Administered 2017-07-03 (×3): via INTRAVENOUS

## 2017-07-03 MED ORDER — LIDOCAINE HCL (PF) 1 % IJ SOLN
INTRAMUSCULAR | Status: DC | PRN
  Administered 2017-07-03 (×2): 4 mL

## 2017-07-03 MED ORDER — OXYTOCIN 40 UNITS IN LACTATED RINGERS INFUSION - SIMPLE MED
1.0000 m[IU]/min | INTRAVENOUS | Status: DC
  Administered 2017-07-03: 2 m[IU]/min via INTRAVENOUS

## 2017-07-03 MED ORDER — HYDROXYZINE HCL 50 MG PO TABS
50.0000 mg | ORAL_TABLET | Freq: Four times a day (QID) | ORAL | Status: DC | PRN
  Filled 2017-07-03: qty 1

## 2017-07-03 MED ORDER — BUTORPHANOL TARTRATE 1 MG/ML IJ SOLN: 1.0000 mg | INTRAMUSCULAR | Status: DC | PRN

## 2017-07-03 MED ORDER — LIDOCAINE HCL (PF) 1 % IJ SOLN
30.0000 mL | INTRAMUSCULAR | Status: DC | PRN
  Filled 2017-07-03: qty 30

## 2017-07-03 MED ORDER — EPHEDRINE 5 MG/ML INJ
10.0000 mg | INTRAVENOUS | Status: DC | PRN
  Filled 2017-07-03: qty 2

## 2017-07-03 MED ORDER — SOD CITRATE-CITRIC ACID 500-334 MG/5ML PO SOLN: 30.0000 mL | ORAL | Status: DC | PRN

## 2017-07-03 MED ORDER — OXYTOCIN 40 UNITS IN LACTATED RINGERS INFUSION - SIMPLE MED: 2.5000 [IU]/h | INTRAVENOUS | Status: DC

## 2017-07-03 MED ORDER — DIPHENHYDRAMINE HCL 50 MG/ML IJ SOLN
12.5000 mg | INTRAMUSCULAR | Status: DC | PRN
  Administered 2017-07-03: 12.5 mg via INTRAVENOUS
  Filled 2017-07-03: qty 1

## 2017-07-03 MED ORDER — PHENYLEPHRINE 40 MCG/ML (10ML) SYRINGE FOR IV PUSH (FOR BLOOD PRESSURE SUPPORT)
80.0000 ug | PREFILLED_SYRINGE | INTRAVENOUS | Status: DC | PRN
  Administered 2017-07-03: 80 ug via INTRAVENOUS
  Filled 2017-07-03: qty 5
  Filled 2017-07-03: qty 10

## 2017-07-03 MED ORDER — OXYCODONE-ACETAMINOPHEN 5-325 MG PO TABS: 2.0000 | ORAL_TABLET | ORAL | Status: DC | PRN

## 2017-07-03 MED ORDER — PHENYLEPHRINE 40 MCG/ML (10ML) SYRINGE FOR IV PUSH (FOR BLOOD PRESSURE SUPPORT)
80.0000 ug | PREFILLED_SYRINGE | INTRAVENOUS | Status: DC | PRN
  Filled 2017-07-03: qty 5

## 2017-07-03 MED ORDER — OXYCODONE-ACETAMINOPHEN 5-325 MG PO TABS: 1.0000 | ORAL_TABLET | ORAL | Status: DC | PRN

## 2017-07-03 MED ORDER — TERBUTALINE SULFATE 1 MG/ML IJ SOLN
0.2500 mg | Freq: Once | INTRAMUSCULAR | Status: DC | PRN
  Filled 2017-07-03: qty 1

## 2017-07-03 MED ORDER — OXYTOCIN 40 UNITS IN LACTATED RINGERS INFUSION - SIMPLE MED
INTRAVENOUS | Status: AC
  Filled 2017-07-03: qty 1000

## 2017-07-03 MED ORDER — FENTANYL 2.5 MCG/ML BUPIVACAINE 1/10 % EPIDURAL INFUSION (WH - ANES)
14.0000 mL/h | INTRAMUSCULAR | Status: DC | PRN
  Administered 2017-07-03: 14 mL/h via EPIDURAL
  Filled 2017-07-03: qty 100

## 2017-07-03 MED ORDER — LACTATED RINGERS IV SOLN: 500.0000 mL | INTRAVENOUS | Status: DC | PRN

## 2017-07-03 MED ORDER — ONDANSETRON HCL 4 MG/2ML IJ SOLN: 4.0000 mg | Freq: Four times a day (QID) | INTRAMUSCULAR | Status: DC | PRN

## 2017-07-03 MED ORDER — OXYTOCIN BOLUS FROM INFUSION
500.0000 mL | Freq: Once | INTRAVENOUS | Status: AC
  Administered 2017-07-03: 500 mL via INTRAVENOUS

## 2017-07-03 MED ORDER — ACETAMINOPHEN 325 MG PO TABS: 650.0000 mg | ORAL_TABLET | ORAL | Status: DC | PRN

## 2017-07-03 NOTE — Anesthesia Preprocedure Evaluation (Signed)
Anesthesia Evaluation  Patient identified by MRN, date of birth, ID band Patient awake    Reviewed: Allergy & Precautions, NPO status , Patient's Chart, lab work & pertinent test results  Airway Mallampati: II  TM Distance: >3 FB Neck ROM: Full    Dental no notable dental hx.    Pulmonary asthma , Current Smoker,    Pulmonary exam normal breath sounds clear to auscultation       Cardiovascular negative cardio ROS Normal cardiovascular exam Rhythm:Regular Rate:Normal     Neuro/Psych PSYCHIATRIC DISORDERS negative neurological ROS     GI/Hepatic negative GI ROS, Neg liver ROS,   Endo/Other  negative endocrine ROS  Renal/GU negative Renal ROS     Musculoskeletal negative musculoskeletal ROS (+)   Abdominal   Peds  Hematology negative hematology ROS (+)   Anesthesia Other Findings   Reproductive/Obstetrics negative OB ROS                             Anesthesia Physical Anesthesia Plan  ASA: II  Anesthesia Plan: Epidural   Post-op Pain Management:    Induction:   PONV Risk Score and Plan:   Airway Management Planned:   Additional Equipment:   Intra-op Plan:   Post-operative Plan:   Informed Consent: I have reviewed the patients History and Physical, chart, labs and discussed the procedure including the risks, benefits and alternatives for the proposed anesthesia with the patient or authorized representative who has indicated his/her understanding and acceptance.     Plan Discussed with:   Anesthesia Plan Comments:         Anesthesia Quick Evaluation

## 2017-07-03 NOTE — Progress Notes (Signed)
Patient ID: Kim Freeman, female   DOB: 05/13/1981, 36 y.o.   MRN: [id number] Pt doing well with no complaints. +Fmss. Not appreciating contractions.  VSS CAT 1, 130 No regular contractions noted Pitocin at 10mus SVE: 4/80/-2; clear fluid  A/P: Progressing in labor         Readjust toco         Continue with pitocin per protocol         Pain control prn         Recheck in 2-3hours or prn         Anticipate svd

## 2017-07-03 NOTE — Anesthesia Pain Management Evaluation Note (Signed)
  CRNA Pain Management Visit Note  Patient: Kim Freeman, 36 y.o., female  "Hello I am a member of the anesthesia team at [facility]. We have an anesthesia team available at all times to provide care throughout the hospital, including epidural management and anesthesia for C-section. I don't know your plan for the delivery whether it a natural birth, water birth, IV sedation, nitrous supplementation, doula or epidural, but we want to meet your pain goals."   1.Was your pain managed to your expectations on prior hospitalizations?   Yes   2.What is your expectation for pain management during this hospitalization?     Epidural  3.How can we help you reach that goal? epidural  Record the patient's initial score and the patient's pain goal.   Pain: 0  Pain Goal: 2 [facility] wants you to be able to say your pain was always managed very well.  Kim Freeman 07/03/2017

## 2017-07-03 NOTE — H&P (Signed)
Kim Freeman is a 36 y.o. female presenting for scheduled electerive iol at term.Her prenatal care has been benign- had b/l enlarged renal pelvices but resolved by early third trimester.  FOB not involved. Prior tobacco use. LGSIL on pap. GBS is negative . Hx ADD OB History    Gravida Para Term Preterm AB Living   3 2 2  0 0 2   SAB TAB Ectopic Multiple Live Births   0 0 0 0 2     Past Medical History:  Diagnosis Date  . ADHD   . ADHD   . Asthma    exercise, bronchitis induced; not on inhaler currently  . Broken ribs   . Fracture, hip (HCC)   . Late prenatal care   . Vaginal Pap smear, abnormal    Past Surgical History:  Procedure Laterality Date  . CHEST TUBE INSERTION    . STYLOID PROCESS EXCISION     Family History: family history includes Bipolar disorder in her father; Cancer in her father, mother, and paternal grandfather; Depression in her father; Diabetes in her maternal grandfather, maternal grandmother, and paternal grandfather; Drug abuse in her father; Hyperlipidemia in her father, maternal grandfather, mother, and paternal grandfather; Hypertension in her father, maternal grandfather, maternal grandmother, mother, and paternal grandfather; Schizophrenia in her paternal grandmother. Social History:  reports that she has been smoking Cigarettes.  She has a 5.00 pack-year smoking history. She has quit using smokeless tobacco. She reports that she drinks alcohol. She reports that she does not use drugs.     Maternal Diabetes: No Genetic Screening: Normal esential panel nl Maternal Ultrasounds/Referrals: Normal Fetal Ultrasounds or other Referrals:  None Maternal Substance Abuse:  Yes:  Type: Smoker tobacco Significant Maternal Medications:  Meds include: Other: dextromamphetamine-amphetamine 30mg  prn Significant Maternal Lab Results:  Lab values include: Group B Strep negative Other Comments:  None  Review of Systems  Constitutional: Positive for malaise/fatigue.  Negative for chills, fever and weight loss.  Eyes: Negative for blurred vision and double vision.  Respiratory: Negative for shortness of breath.   Cardiovascular: Negative for chest pain.  Gastrointestinal: Positive for abdominal pain. Negative for heartburn, nausea and vomiting.  Musculoskeletal: Positive for back pain.  Skin: Negative for itching and rash.  Neurological: Negative for dizziness and headaches.  Endo/Heme/Allergies: Does not bruise/bleed easily.  Psychiatric/Behavioral: Negative for depression, hallucinations, substance abuse and suicidal ideas. The patient is nervous/anxious.    Maternal Medical History:  Reason for admission: Nausea. Elective iol  Contractions: Frequency: rare.   Perceived severity is mild.    Fetal activity: Perceived fetal activity is normal.   Last perceived fetal movement was within the past hour.    Prenatal complications: no prenatal complications Prenatal Complications - Diabetes: none.    Dilation: 4 Effacement (%): 80 Station: -2 Exam by:: Dr. Mindi Slicker Blood pressure 120/83, pulse 93, temperature 98 F (36.7 C), temperature source Oral, resp. rate 18, height 5\' 3"  (1.6 m), weight 206 lb (93.4 kg), last menstrual period 10/02/2016. Maternal Exam:  Uterine Assessment: Contraction strength is mild.  Contraction frequency is rare.   Abdomen: Patient reports generalized tenderness.  Estimated fetal weight is AGA.   Fetal presentation: vertex  Introitus: Normal vulva. Vulva is negative for condylomata and lesion.  Normal vagina.  Vagina is negative for condylomata and discharge.  Amniotic fluid character: clear.  Pelvis: adequate for delivery.   Cervix: Cervix evaluated by digital exam.     Physical Exam  Constitutional: She is oriented to person,  place, and time. She appears well-developed and well-nourished.  HENT:  Head: Normocephalic.  Cardiovascular: Normal rate.   Respiratory: Effort normal.  GI: There is generalized  tenderness.  Genitourinary: Vagina normal and uterus normal. Vulva exhibits no lesion. No vaginal discharge found.  Musculoskeletal: Normal range of motion.  Neurological: She is alert and oriented to person, place, and time.  Skin: Skin is warm.  Psychiatric: She has a normal mood and affect. Her behavior is normal. Judgment and thought content normal.    Prenatal labs: ABO, Rh: --/--/A NEG (07/09 0805) Antibody: POS (07/09 0805) Rubella: Immune (02/26 0000) RPR: Nonreactive (02/26 0000)  HBsAg: Negative (02/26 0000)  HIV: Non-reactive (02/26 0000)  GBS: Negative (06/13 0000)   Assessment/Plan: [id number] at 39 1/7wks for elective iol at term AROM/Pitocin Pain control prn Anticipate svd GBS neg   Kim Freeman 07/03/2017, 1:45 PM

## 2017-07-03 NOTE — Anesthesia Procedure Notes (Signed)
Epidural Patient location during procedure: OB  Staffing Anesthesiologist: Tildon Silveria Performed: anesthesiologist   Preanesthetic Checklist Completed: patient identified, pre-op evaluation, timeout performed, IV checked, risks and benefits discussed and monitors and equipment checked  Epidural Patient position: sitting Prep: site prepped and draped and DuraPrep Patient monitoring: heart rate, continuous pulse ox and blood pressure Approach: midline Location: L3-L4 Injection technique: LOR air and LOR saline  Needle:  Needle type: Tuohy  Needle gauge: 17 G Needle length: 9 cm Needle insertion depth: 7 cm Catheter type: closed end flexible Catheter size: 19 Gauge Catheter at skin depth: 12 cm Test dose: negative  Assessment Sensory level: T8 Events: blood not aspirated, injection not painful, no injection resistance, negative IV test and no paresthesia  Additional Notes Reason for block:procedure for pain     

## 2017-07-03 NOTE — Progress Notes (Signed)
Patient ID: Kim Freeman, female   DOB: 11/26/1981, 36 y.o.   MRN: [id number] Pt reports increasing discomfort with ctxs. Requests epidural VSS CAT 1 SVE 5/90/-2 Ctxs q 1-303mins  A/P: Progressing well in labor         Anesthesia notified         Recheck in 1-2hrs         Expectant mgmt

## 2017-07-03 NOTE — Progress Notes (Signed)
Patient ID: Kim Freeman, female   DOB: 09/02/1981, 36 y.o.   MRN: [id number] Pt doing well.  CAT 1 strip Irritability but no regular contractions noted AROM performed with small return of clear fluid 3.5/70/-2  A/P: G3P2002 at term - stable         If no contractions in next 30mins start pitocin         Anticipate svd         Pain control prn

## 2017-07-03 NOTE — Progress Notes (Signed)
Patient ID: Kim Freeman, female   DOB: 12/30/1980, 36 y.o.   MRN: [id number] Pt comfortable with epidural. Has no complaints. Anxious VSS CAT 1 9/100/0  A/P: Anticipate svd         Recheck in an hour

## 2017-07-04 ENCOUNTER — Encounter (HOSPITAL_COMMUNITY): Payer: Self-pay

## 2017-07-04 ENCOUNTER — Inpatient Hospital Stay (HOSPITAL_COMMUNITY): Payer: BLUE CROSS/BLUE SHIELD | Admitting: Anesthesiology

## 2017-07-04 ENCOUNTER — Encounter (HOSPITAL_COMMUNITY)
Admission: RE | Disposition: A | Discharge status: Home / Self Care | Payer: Self-pay | Source: Ambulatory Visit | Attending: Obstetrics and Gynecology

## 2017-07-04 LAB — CBC
HEMATOCRIT: 29.4 % — AB (ref 36.0–46.0)
Hemoglobin: 10.3 g/dL — ABNORMAL LOW (ref 12.0–15.0)
MCH: 31 pg (ref 26.0–34.0)
MCHC: 35 g/dL (ref 30.0–36.0)
MCV: 88.6 fL (ref 78.0–100.0)
Platelets: 174 10*3/uL (ref 150–400)
RBC: 3.32 MIL/uL — ABNORMAL LOW (ref 3.87–5.11)
RDW: 13.9 % (ref 11.5–15.5)
WBC: 11.6 10*3/uL — ABNORMAL HIGH (ref 4.0–10.5)

## 2017-07-04 SURGERY — LIGATION, FALLOPIAN TUBE, POSTPARTUM
Anesthesia: Choice

## 2017-07-04 MED ORDER — ONDANSETRON HCL 4 MG/2ML IJ SOLN: 4.0000 mg | INTRAMUSCULAR | Status: DC | PRN

## 2017-07-04 MED ORDER — RHO D IMMUNE GLOBULIN 1500 UNIT/2ML IJ SOSY
300.0000 ug | PREFILLED_SYRINGE | Freq: Once | INTRAMUSCULAR | Status: AC
  Administered 2017-07-04: 300 ug via INTRAVENOUS
  Filled 2017-07-04: qty 2

## 2017-07-04 MED ORDER — SIMETHICONE 80 MG PO CHEW
80.0000 mg | CHEWABLE_TABLET | ORAL | Status: DC | PRN
Start: 2017-07-04 — End: 2017-07-05

## 2017-07-04 MED ORDER — ACETAMINOPHEN 325 MG PO TABS: 650.0000 mg | ORAL_TABLET | ORAL | Status: DC | PRN

## 2017-07-04 MED ORDER — OXYCODONE HCL 5 MG PO TABS: 5.0000 mg | ORAL_TABLET | ORAL | Status: DC | PRN

## 2017-07-04 MED ORDER — TETANUS-DIPHTH-ACELL PERTUSSIS 5-2.5-18.5 LF-MCG/0.5 IM SUSP: 0.5000 mL | Freq: Once | INTRAMUSCULAR | Status: DC

## 2017-07-04 MED ORDER — FAMOTIDINE 20 MG PO TABS
40.0000 mg | ORAL_TABLET | Freq: Once | ORAL | Status: AC
  Administered 2017-07-04: 40 mg via ORAL
  Filled 2017-07-04: qty 2

## 2017-07-04 MED ORDER — COCONUT OIL OIL: 1.0000 "application " | TOPICAL_OIL | Status: DC | PRN

## 2017-07-04 MED ORDER — WITCH HAZEL-GLYCERIN EX PADS: 1.0000 "application " | MEDICATED_PAD | CUTANEOUS | Status: DC | PRN

## 2017-07-04 MED ORDER — ONDANSETRON HCL 4 MG PO TABS: 4.0000 mg | ORAL_TABLET | ORAL | Status: DC | PRN

## 2017-07-04 MED ORDER — AMPHETAMINE-DEXTROAMPHETAMINE 10 MG PO TABS
30.0000 mg | ORAL_TABLET | Freq: Two times a day (BID) | ORAL | Status: DC
  Filled 2017-07-04 (×5): qty 3

## 2017-07-04 MED ORDER — LACTATED RINGERS IV SOLN
INTRAVENOUS | Status: DC
  Administered 2017-07-04: 08:00:00 via INTRAVENOUS

## 2017-07-04 MED ORDER — AMPHETAMINE-DEXTROAMPHETAMINE 10 MG PO TABS
10.0000 mg | ORAL_TABLET | Freq: Once | ORAL | Status: AC
  Administered 2017-07-04: 10 mg via ORAL

## 2017-07-04 MED ORDER — PRENATAL MULTIVITAMIN CH
1.0000 | ORAL_TABLET | Freq: Every day | ORAL | Status: DC
  Administered 2017-07-04 – 2017-07-05 (×2): 1 via ORAL
  Filled 2017-07-04 (×2): qty 1

## 2017-07-04 MED ORDER — OXYCODONE HCL 5 MG PO TABS: 10.0000 mg | ORAL_TABLET | ORAL | Status: DC | PRN

## 2017-07-04 MED ORDER — DIPHENHYDRAMINE HCL 25 MG PO CAPS: 25.0000 mg | ORAL_CAPSULE | Freq: Four times a day (QID) | ORAL | Status: DC | PRN

## 2017-07-04 MED ORDER — SENNOSIDES-DOCUSATE SODIUM 8.6-50 MG PO TABS
2.0000 | ORAL_TABLET | ORAL | Status: DC
  Administered 2017-07-05: 2 via ORAL
  Filled 2017-07-04: qty 2

## 2017-07-04 MED ORDER — DIBUCAINE 1 % RE OINT: 1.0000 "application " | TOPICAL_OINTMENT | RECTAL | Status: DC | PRN

## 2017-07-04 MED ORDER — IBUPROFEN 600 MG PO TABS
600.0000 mg | ORAL_TABLET | Freq: Four times a day (QID) | ORAL | Status: DC
  Administered 2017-07-04 – 2017-07-05 (×5): 600 mg via ORAL
  Filled 2017-07-04 (×5): qty 1

## 2017-07-04 MED ORDER — BENZOCAINE-MENTHOL 20-0.5 % EX AERO: 1.0000 "application " | INHALATION_SPRAY | CUTANEOUS | Status: DC | PRN

## 2017-07-04 MED ORDER — METOCLOPRAMIDE HCL 10 MG PO TABS
10.0000 mg | ORAL_TABLET | Freq: Once | ORAL | Status: AC
  Administered 2017-07-04: 10 mg via ORAL
  Filled 2017-07-04: qty 1

## 2017-07-04 MED ORDER — ZOLPIDEM TARTRATE 5 MG PO TABS: 5.0000 mg | ORAL_TABLET | Freq: Every evening | ORAL | Status: DC | PRN

## 2017-07-04 NOTE — Anesthesia Postprocedure Evaluation (Signed)
Anesthesia Post Note  Patient: Kim Freeman  Procedure(s) Performed: * No procedures listed *     Patient location during evaluation: Mother Baby Anesthesia Type: Epidural Level of consciousness: awake and alert Pain management: pain level controlled Vital Signs Assessment: post-procedure vital signs reviewed and stable Respiratory status: spontaneous breathing, nonlabored ventilation and respiratory function stable Cardiovascular status: stable Postop Assessment: no headache, no backache and epidural receding Anesthetic complications: no    Last Vitals:  Vitals:   07/04/17 0030 07/04/17 0130  BP: 119/76 114/68  Pulse: 85 82  Resp: 16 16  Temp: 36.7 C 36.6 C    Last Pain:  Vitals:   07/04/17 0425  TempSrc:   PainSc: 0-No pain   Pain Goal: Patients Stated Pain Goal: 2 (07/03/17 0900)               Junious SilkGILBERT,Vito Beg

## 2017-07-04 NOTE — Progress Notes (Signed)
Patient ID: Kim Freeman, female   DOB: 10/01/1981, 36 y.o.   MRN: [id number]  Arrivedto see pt prior to scheduled BTL - reviewed procedure once again. Pt express doubt about wanting BTL done. I reiterated alterntive options that are reversible such as LARCs. Pt decided she is not certain she would like a BTL at this time.  Surgery cancelled.  OR notified.

## 2017-07-04 NOTE — Progress Notes (Signed)
Post Partum Day 1 Subjective: Pt states had good night.  Working on breastfeeding.  NPO since midnight and ready to proceed with tubal.  Objective: Blood pressure 117/75, pulse 76, temperature 98.2 F (36.8 C), temperature source Oral, resp. rate 18, height 5\' 3"  (1.6 m), weight 93.4 kg (206 lb), last menstrual period 10/02/2016, SpO2 99 %, unknown if currently breastfeeding.  Physical Exam:  General: alert and cooperative Lochia: appropriate Uterine Fundus: firm    Recent Labs  07/03/17 0805 07/04/17 0458  HGB 10.7* 10.3*  HCT 30.9* 29.4*    Assessment/Plan: Plan for discharge tomorrow  Dr. Mindi SlickerBanga to do tubal at 0930am.  Reviewed with patient and she is ready to proceed.     LOS: 1 day   Oliver PilaKathy W Chalmer Zheng 07/04/2017, 8:57 AM

## 2017-07-04 NOTE — Lactation Note (Signed)
This note was copied from a baby's chart. Lactation Consultation Note: Mother reports that she breastfed her last child for 5 yrs. She reports that she is seeing colostrum when she hand expresses. Mother is pleased that infant is breastfeeding well. She denied having any discomfort with latch.  Mother advised to continue to cue base feed and breastfeed infant 8-12 times in 24 hours. Mother to do frequent skin to skin. Mother was given Lactation Brochure and informed of all available services.  Patient Name: Kim Roxine CaddyShawn Freeman IHKVQ'QToday's Date: 07/04/2017 Reason for consult: Initial assessment   Maternal Data Has patient been taught Hand Expression?: Yes Does the patient have breastfeeding experience prior to this delivery?: Yes  Feeding Feeding Type: Breast Fed Length of feed: 10 min  LATCH Score/Interventions                      Lactation Tools Discussed/Used     Consult Status Consult Status: Follow-up Date: 07/04/17 Follow-up type: Out-patient    Stevan BornKendrick, Srijan Givan [facility] 07/04/2017, 12:19 PM

## 2017-07-04 NOTE — Progress Notes (Signed)
MOB was referred for history of depression/anxiety. * Referral screened out by Clinical Social Worker because none of the following criteria appear to apply: ~ History of anxiety/depression during this pregnancy, or of post-partum depression. ~ Diagnosis of anxiety and/or depression within last 3 years OR * MOB's symptoms currently being treated with medication and/or therapy. Please contact the Clinical Social Worker if needs arise, or if MOB requests.  Chart notes Anx/Dep "since childhood-therapy helped."  

## 2017-07-05 LAB — TYPE AND SCREEN
ABO/RH(D): A NEG
Antibody Screen: POSITIVE
DAT, IgG: NEGATIVE
UNIT DIVISION: 0
Unit division: 0

## 2017-07-05 LAB — RH IG WORKUP (INCLUDES ABO/RH)
ABO/RH(D): A NEG
FETAL SCREEN: NEGATIVE
Gestational Age(Wks): 39.1
UNIT DIVISION: 0

## 2017-07-05 LAB — BPAM RBC
BLOOD PRODUCT EXPIRATION DATE: 201807242359
BLOOD PRODUCT EXPIRATION DATE: 201807262359
Unit Type and Rh: 600
Unit Type and Rh: 600

## 2017-07-05 MED ORDER — IBUPROFEN 600 MG PO TABS: 600.0000 mg | ORAL_TABLET | Freq: Four times a day (QID) | ORAL | 1 refills | Status: DC

## 2017-07-05 MED ORDER — CONCEPT OB 130-92.4-1 MG PO CAPS: 1.0000 | ORAL_CAPSULE | Freq: Every day | ORAL | 3 refills | Status: DC

## 2017-07-05 NOTE — Discharge Summary (Signed)
OB Discharge Summary     Patient Name: Kim Freeman DOB: 12-02-1981 MRN: [id number]  Date of admission: 07/03/2017 Delivering MD: Pry[facility] Ocho[facility]   Date of discharge: 07/05/2017  Admitting diagnosis: INDUCTION PP BTL Intrauterine pregnancy: [redacted]w[redacted]d     Secondary diagnosis:  Active Problems:   Term pregnancy   Postpartum care following vaginal delivery   SVD (spontaneous vaginal delivery)  Additional problems: none     Discharge diagnosis: Term Pregnancy Delivered                                                                                                Post partum procedures:rhogam  Augmentation: AROM and Pitocin  Complications: None  Hospital course:  Induction of Labor With Vaginal Delivery   36 y.o. yo G3P3003 at [redacted]w[redacted]d was admitted to the hospital 07/03/2017 for induction of labor.  Indication for induction: Favorable cervix at term.  Patient had an uncomplicated labor course as follows: Membrane Rupture Time/Date: 9:49 AM ,07/03/2017   Intrapartum Procedures: Episiotomy: None [1]                                         Lacerations:  None [1]  Patient had delivery of a Viable infant.  Information for the patient's newborn:  Mckynzi, Cammon [[id number]]  Delivery Method: Vag-Spont   07/03/2017  Details of delivery can be found in separate delivery note.  Patient had a routine postpartum course. Patient is discharged home 07/05/17.  Physical exam  Vitals:   07/04/17 0130 07/04/17 0735 07/04/17 1849 07/05/17 0558  BP: 114/68 117/75 112/72 127/73  Pulse: 82 76 72 77  Resp: 16 18 17 18   Temp: 97.8 F (36.6 C) 98.2 F (36.8 C) 98.4 F (36.9 C) 98 F (36.7 C)  TempSrc: Oral Oral Oral Oral  SpO2: 99% 99% 98% 99%  Weight:      Height:       General: alert and no distress Lochia: appropriate Uterine Fundus: firm  Labs: Lab Results  Component Value Date   WBC 11.6 (H) 07/04/2017   HGB 10.3 (L) 07/04/2017   HCT 29.4 (L) 07/04/2017   MCV 88.6 07/04/2017    PLT 174 07/04/2017   No flowsheet data found.  Discharge instruction: per After Visit Summary and "Baby and Me Booklet".  After visit meds:  Allergies as of 07/05/2017   No Known Allergies     Medication List    TAKE these medications   amphetamine-dextroamphetamine 30 MG tablet Commonly known as:  ADDERALL Take 30 mg by mouth 2 (two) times daily.   CONCEPT OB 130-92.4-1 MG Caps Take 1 tablet by mouth daily.   ibuprofen 600 MG tablet Commonly known as:  ADVIL,MOTRIN Take 1 tablet (600 mg total) by mouth every 6 (six) hours.       Diet: routine diet  Activity: Advance as tolerated. Pelvic rest for 6 weeks.   Outpatient follow up:6 weeks Follow up Appt:No future appointments. Follow up Visit:No Follow-up on file.  Postpartum contraception: IUD ?  Newborn Data: Live born female  Birth Weight: 6 lb 8.4 oz (2960 g) APGAR: 8, 9  Baby Feeding: Breast Disposition:home with mother   07/05/2017 Sherian ReinJody Bovard-Stuckert, MD

## 2017-07-05 NOTE — Progress Notes (Signed)
Post Partum Day 2 Subjective: no complaints, up ad lib, voiding, tolerating PO and nl lochiam pain controlled  Objective: Blood pressure 127/73, pulse 77, temperature 98 F (36.7 C), temperature source Oral, resp. rate 18, height 5\' 3"  (1.6 m), weight 93.4 kg (206 lb), last menstrual period 10/02/2016, SpO2 99 %, unknown if currently breastfeeding.  Physical Exam:  General: alert and no distress Lochia: appropriate Uterine Fundus: firm   Recent Labs  07/03/17 0805 07/04/17 0458  HGB 10.7* 10.3*  HCT 30.9* 29.4*    Assessment/Plan: Discharge home, Breastfeeding and Lactation consult.  Routine PP care/instructions.  D/c with motrin and PNV,  F/u 6 weeks   LOS: 2 days   Kniyah Khun Bovard-Stuckert 07/05/2017, 7:40 AM

## 2017-07-05 NOTE — Lactation Note (Signed)
This note was copied from a baby's chart. Lactation Consultation Note: Mother reports that infant is feeding well. She denies having any discomfort when feeding. Mother reports that her breast are beginning to fill . Mother advised to do good breast massage to prevent engorgement. Reviewed S/S of Mastiits. Mother made aware of available LC services as needed and community support. Mother denies having any questions or concerns.   Patient Name: Kim Freeman Asche ZOXWR'UToday's Date: 07/05/2017 Reason for consult: Follow-up assessment   Maternal Data    Feeding Feeding Type: Breast Fed Length of feed: 5 min  LATCH Score/Interventions Latch: Grasps breast easily, tongue down, lips flanged, rhythmical sucking.  Audible Swallowing: None  Type of Nipple: Everted at rest and after stimulation  Comfort (Breast/Nipple): Soft / non-tender     Hold (Positioning): No assistance needed to correctly position infant at breast.  LATCH Score: 8  Lactation Tools Discussed/Used     Consult Status Consult Status: Complete    Michel BickersKendrick, Abria Vannostrand McCoy 07/05/2017, 10:13 AM

## 2017-07-18 ENCOUNTER — Ambulatory Visit (INDEPENDENT_AMBULATORY_CARE_PROVIDER_SITE_OTHER): Payer: BLUE CROSS/BLUE SHIELD | Admitting: Physician Assistant

## 2017-07-18 ENCOUNTER — Encounter: Payer: Self-pay | Admitting: Physician Assistant

## 2017-07-18 VITALS — BP 126/80 | HR 70 | Temp 97.5°F | Resp 18 | Ht 66.0 in | Wt 186.6 lb

## 2017-07-18 DIAGNOSIS — F17200 Nicotine dependence, unspecified, uncomplicated: Secondary | ICD-10-CM | POA: Diagnosis not present

## 2017-07-18 DIAGNOSIS — F988 Other specified behavioral and emotional disorders with onset usually occurring in childhood and adolescence: Secondary | ICD-10-CM

## 2017-07-18 MED ORDER — AMPHETAMINE-DEXTROAMPHETAMINE 30 MG PO TABS: 30.0000 mg | ORAL_TABLET | Freq: Two times a day (BID) | ORAL | 0 refills | Status: DC

## 2017-07-18 NOTE — Patient Instructions (Addendum)
     IF you received an x-ray today, you will receive an invoice from [facility]. Please contact [facility] at [phone] with questions or concerns regarding your invoice.   IF you received labwork today, you will receive an invoice from [facility]. Please contact [facility] at [phone] with questions or concerns regarding your invoice.   Our billing staff will not be able to assist you with questions regarding bills from these companies.  You will be contacted with the lab results as soon as they are available. The fastest way to get your results is to activate your My Chart account. Instructions are located on the last page of this paperwork. If you have not heard from us regarding the results in 2 weeks, please contact this office.    Did you know that you begin to benefit from quitting smoking within the first twenty minutes? It's TRUE.  At 20 minutes: -blood pressure decreases -pulse rate drops -body temperature of hands and feet increases  At 8 hours: -carbon monoxide level in blood drops to normal -oxygen level in blood increases to normal  At 24 hours: -the chance of heart attack decreases  At 48 hours: -nerve endings start regrowing -ability to smell and taste is enhanced  2 weeks-3 months: -circulation improves -walking becomes easier -lung function improves  1-9 months: -coughing, sinus congestion, fatigue and shortness of breath decreases  1 year: -excess risk of heart disease is decreased to HALF that of a smoker  5 years: Stroke risk is reduced to that of people who have never smoked  10 years: -risk of lung cancer drops to as little as half that of continuing smokers -risk of cancer of the mouth, throat, esophagus, bladder, kidney and pancreas decreases -risk of ulcer decreases  15 years -risk of heart disease is now similar to that of people who have never smoked -risk of death returns to nearly the level of people who have  never smoked   

## 2017-07-18 NOTE — Progress Notes (Signed)
Patient ID: Kim Freeman, female    DOB: [birth year], 36 y.o.   MRN: [id number]  PCP: Patient, No Pcp Per  Chief Complaint  Patient presents with  . Medication    pt would like talk about Adderall rx    Subjective:   Presents for evaluation of ADD. I have not seen her in a number of years, and may not have actually seen her as a patient previously, though I have seen her two older daughters in the past. She notes that I care for her father, Delia Chimes, which she just discovered several days ago.  She is accompanied today by her 2 younger daughters: "Sassy" and Solaris (41 days old today).  Seeing Dr. Toni Arthurs x 6-7 years for ADD treatment. Has a balance there, and so cannot get prescriptions. Her OB has been prescribing it temporarily. Feels like the relationship with Dr. Toni Arthurs is damaged, and visits there isn't helpful.  She tried to go without the Adderall, and cut back on the dose so that her supply would last longer, but wasn't able to function to the level that she needs with 3 daughters at home. Without the stimulant, she has no energy, fatigue, dysphoric mood, no drive/motivation.  Has support from her parents, her older daughters' father (finanically). Her baby's father isn't able to provide financial support, and wasn't able to be present emotionally during her pregancy. She relates that he tries, but just isn't capable of "being there." Nursing. Dr. Karilyn Cota is aware of the stimulant use during pregnancy and nursing.   Review of Systems As above. Denies depression, anxiety.    Patient Active Problem List   Diagnosis Date Noted  . Postpartum care following vaginal delivery 07/04/2017  . SVD (spontaneous vaginal delivery) 07/04/2017  . Term pregnancy 07/03/2017     Prior to Admission medications   Medication Sig Start Date End Date Taking? Authorizing Provider  amphetamine-dextroamphetamine (ADDERALL) 30 MG tablet Take 30 mg by mouth 2 (two) times daily.   Yes  [provider]  ibuprofen (ADVIL,MOTRIN) 600 MG tablet Take 1 tablet (600 mg total) by mouth every 6 (six) hours. Patient not taking: Reported on 07/18/2017 07/05/17   Bovard-Stuckert, Augusto Gamble, MD  Prenat w/o A Vit-FeFum-FePo-FA (CONCEPT OB) 130-92.4-1 MG CAPS Take 1 tablet by mouth daily. Patient not taking: Reported on 07/18/2017 07/05/17   Sherian Rein, MD     Allergies  Allergen Reactions  . Fentanyl Itching       Objective:  Physical Exam  Constitutional: She is oriented to person, place, and time. She appears well-developed and well-nourished. She is active and cooperative. No distress.  BP 126/80 (BP Location: Right Arm, Patient Position: Sitting, Cuff Size: Large)   Pulse 70   Temp (!) 97.5 F (36.4 C) (Oral)   Resp 18   Ht 5\' 6"  (1.676 m)   Wt 186 lb 9.6 oz (84.6 kg)   LMP 10/02/2016 (Exact Date)   SpO2 98%   BMI 30.12 kg/m   HENT:  Head: Normocephalic and atraumatic.  Right Ear: Hearing normal.  Left Ear: Hearing normal.  Eyes: Conjunctivae are normal. No scleral icterus.  Neck: Normal range of motion. Neck supple. No thyromegaly present.  Cardiovascular: Normal rate, regular rhythm and normal heart sounds.   Pulses:      Radial pulses are 2+ on the right side, and 2+ on the left side.  Pulmonary/Chest: Effort normal and breath sounds normal.  Lymphadenopathy:       Head (right  side): No tonsillar, no preauricular, no posterior auricular and no occipital adenopathy present.       Head (left side): No tonsillar, no preauricular, no posterior auricular and no occipital adenopathy present.    She has no cervical adenopathy.       Right: No supraclavicular adenopathy present.       Left: No supraclavicular adenopathy present.  Neurological: She is alert and oriented to person, place, and time. No sensory deficit.  Skin: Skin is warm, dry and intact. No rash noted. No cyanosis or erythema. Nails show no clubbing.  Psychiatric: She has a normal mood and  affect. Her speech is normal and behavior is normal.           Assessment & Plan:   Problem List Items Addressed This Visit    Smoker    Encouraged smoking cessation.      Attention deficit disorder - Primary    Continue Adderall 30 mg BID. Contact me monthly for prescriptions. Re-evaluate in 3 months.       Relevant Medications   amphetamine-dextroamphetamine (ADDERALL) 30 MG tablet       Return in about 3 months (around 10/18/2017) for re-evaluation of ADD.   Fernande Brashelle S. Ming Kunka, PA-C Primary Care at [facility]

## 2017-07-19 DIAGNOSIS — F988 Other specified behavioral and emotional disorders with onset usually occurring in childhood and adolescence: Secondary | ICD-10-CM | POA: Insufficient documentation

## 2017-07-19 NOTE — Assessment & Plan Note (Addendum)
Continue Adderall 30 mg BID. Contact me monthly for prescriptions. Re-evaluate in 3 months.

## 2017-07-19 NOTE — Assessment & Plan Note (Signed)
Encouraged smoking cessation 

## 2017-08-11 DIAGNOSIS — Z1389 Encounter for screening for other disorder: Secondary | ICD-10-CM | POA: Diagnosis not present

## 2017-08-11 DIAGNOSIS — Z3009 Encounter for other general counseling and advice on contraception: Secondary | ICD-10-CM | POA: Diagnosis not present

## 2017-08-16 ENCOUNTER — Other Ambulatory Visit: Payer: Self-pay | Admitting: Family Medicine

## 2017-08-16 DIAGNOSIS — F988 Other specified behavioral and emotional disorders with onset usually occurring in childhood and adolescence: Secondary | ICD-10-CM

## 2017-08-16 NOTE — Telephone Encounter (Signed)
PT NEED A REFILL ON ADDERALL

## 2017-08-17 MED ORDER — AMPHETAMINE-DEXTROAMPHETAMINE 30 MG PO TABS: 30.0000 mg | ORAL_TABLET | Freq: Two times a day (BID) | ORAL | 0 refills | Status: DC

## 2017-08-17 NOTE — Telephone Encounter (Signed)
Meds ordered this encounter  Medications  . amphetamine-dextroamphetamine (ADDERALL) 30 MG tablet    Sig: Take 1 tablet by mouth 2 (two) times daily.    Dispense:  60 tablet    Refill:  0    Order Specific Question:   Supervising Provider    Answer:   Clelia Croft, EVA N [[id number]]

## 2017-08-17 NOTE — Telephone Encounter (Signed)
Patient notified script is ready for pick up

## 2017-08-17 NOTE — Telephone Encounter (Signed)
Please advise 

## 2017-09-14 ENCOUNTER — Other Ambulatory Visit: Payer: Self-pay | Admitting: Physician Assistant

## 2017-09-14 DIAGNOSIS — F988 Other specified behavioral and emotional disorders with onset usually occurring in childhood and adolescence: Secondary | ICD-10-CM

## 2017-09-14 NOTE — Telephone Encounter (Signed)
Pt is calling to request a refill for Adderall.  Please advise when ready for pick up.  [phone]

## 2017-09-18 MED ORDER — AMPHETAMINE-DEXTROAMPHETAMINE 30 MG PO TABS: 30.0000 mg | ORAL_TABLET | Freq: Two times a day (BID) | ORAL | 0 refills | Status: DC

## 2017-09-18 NOTE — Telephone Encounter (Signed)
Meds ordered this encounter  Medications  . amphetamine-dextroamphetamine (ADDERALL) 30 MG tablet    Sig: Take 1 tablet by mouth 2 (two) times daily.    Dispense:  60 tablet    Refill:  0    Order Specific Question:   Supervising Provider    Answer:   SHAW, EVA N [[id number]]    

## 2017-10-20 ENCOUNTER — Ambulatory Visit (INDEPENDENT_AMBULATORY_CARE_PROVIDER_SITE_OTHER): Payer: BLUE CROSS/BLUE SHIELD | Admitting: Physician Assistant

## 2017-10-20 ENCOUNTER — Encounter: Payer: Self-pay | Admitting: Physician Assistant

## 2017-10-20 VITALS — BP 118/74 | HR 115 | Temp 98.0°F | Resp 18 | Ht 66.0 in | Wt 189.0 lb

## 2017-10-20 DIAGNOSIS — F988 Other specified behavioral and emotional disorders with onset usually occurring in childhood and adolescence: Secondary | ICD-10-CM | POA: Diagnosis not present

## 2017-10-20 DIAGNOSIS — F17200 Nicotine dependence, unspecified, uncomplicated: Secondary | ICD-10-CM | POA: Diagnosis not present

## 2017-10-20 MED ORDER — AMPHETAMINE-DEXTROAMPHETAMINE 30 MG PO TABS: 30.0000 mg | ORAL_TABLET | Freq: Two times a day (BID) | ORAL | 0 refills | Status: DC

## 2017-10-20 NOTE — Progress Notes (Signed)
Patient ID: Kim SkeeterShawn M Freeman, female    DOB: 03/29/1981, 36 y.o.   MRN: [id number]  PCP: Kim OarJeffery, Kim Surratt, PA-C  Chief Complaint  Patient presents with  . ADD  . Follow-up    Subjective:   Presents for evaluation of ADD.  Doing well on the current dose of Adderall. Helps her focus, concentrate, complete tasks. Not causing difficulty falling asleep, agitation, palpitations. Recalls getting tetanus vaccine at Dr. Shella SpearingBanga's office during her last pregnancy, delivery in July, but I don't have documentation of that..   Review of Systems No chest pain, SOB, HA, dizziness, vision change, N/V, diarrhea, constipation, dysuria, urinary urgency or frequency, myalgias, arthralgias or rash.    Patient Active Problem List   Diagnosis Date Noted  . Attention deficit disorder 07/19/2017  . Smoker 07/18/2017     Prior to Admission medications   Medication Sig Start Date End Date Taking? Authorizing Provider  amphetamine-dextroamphetamine (ADDERALL) 30 MG tablet Take 1 tablet by mouth 2 (two) times daily. 09/18/17  Yes Kim OarJeffery, Kim Buttermore, PA-C     Allergies  Allergen Reactions  . Fentanyl Itching       Objective:  Physical Exam  Constitutional: She is oriented to person, place, and time. She appears well-developed and well-nourished. She is active and cooperative. No distress.  BP 118/74 (BP Location: Left Arm, Patient Position: Sitting, Cuff Size: Large)   Pulse (!) 115   Temp 98 F (36.7 C) (Oral)   Resp 18   Ht 5\' 6"  (1.676 m)   Wt 189 lb (85.7 kg)   LMP 10/06/2017   SpO2 97%   Breastfeeding? Yes   BMI 30.51 kg/m   HENT:  Head: Normocephalic and atraumatic.  Right Ear: Hearing normal.  Left Ear: Hearing normal.  Eyes: Conjunctivae are normal. No scleral icterus.  Neck: Normal range of motion. Neck supple. No thyromegaly present.  Cardiovascular: Normal rate, regular rhythm and normal heart sounds.  Pulses:      Radial pulses are 2+ on the right side, and 2+ on the left  side.  Pulmonary/Chest: Effort normal and breath sounds normal.  Lymphadenopathy:       Head (right side): No tonsillar, no preauricular, no posterior auricular and no occipital adenopathy present.       Head (left side): No tonsillar, no preauricular, no posterior auricular and no occipital adenopathy present.    She has no cervical adenopathy.       Right: No supraclavicular adenopathy present.       Left: No supraclavicular adenopathy present.  Neurological: She is alert and oriented to person, place, and time. No sensory deficit.  Skin: Skin is warm, dry and intact. No rash noted. No cyanosis or erythema. Nails show no clubbing.  Psychiatric: She has a normal mood and affect. Her speech is normal and behavior is normal.       Wt Readings from Last 3 Encounters:  10/20/17 189 lb (85.7 kg)  07/18/17 186 lb 9.6 oz (84.6 kg)  07/03/17 206 lb (93.4 kg)       Assessment & Plan:   Problem List Items Addressed This Visit    Smoker    COntinued counseling on smoking cessation.      Attention deficit disorder - Primary    Controlled. Continue current treatment.      Relevant Medications   amphetamine-dextroamphetamine (ADDERALL) 30 MG tablet       Return in about 3 months (around 01/20/2018) for re-evaluation of attention, smoking.   Kim Freeman  Kim Dane, PA-C Primary Care at [facility]

## 2017-10-20 NOTE — Patient Instructions (Addendum)
     IF you received an x-ray today, you will receive an invoice from [facility]. Please contact [facility] at [phone] with questions or concerns regarding your invoice.   IF you received labwork today, you will receive an invoice from [facility]. Please contact [facility] at [phone] with questions or concerns regarding your invoice.   Our billing staff will not be able to assist you with questions regarding bills from these companies.  You will be contacted with the lab results as soon as they are available. The fastest way to get your results is to activate your My Chart account. Instructions are located on the last page of this paperwork. If you have not heard from us regarding the results in 2 weeks, please contact this office.    Did you know that you begin to benefit from quitting smoking within the first twenty minutes? It's TRUE.  At 20 minutes: -blood pressure decreases -pulse rate drops -body temperature of hands and feet increases  At 8 hours: -carbon monoxide level in blood drops to normal -oxygen level in blood increases to normal  At 24 hours: -the chance of heart attack decreases  At 48 hours: -nerve endings start regrowing -ability to smell and taste is enhanced  2 weeks-3 months: -circulation improves -walking becomes easier -lung function improves  1-9 months: -coughing, sinus congestion, fatigue and shortness of breath decreases  1 year: -excess risk of heart disease is decreased to HALF that of a smoker  5 years: Stroke risk is reduced to that of people who have never smoked  10 years: -risk of lung cancer drops to as little as half that of continuing smokers -risk of cancer of the mouth, throat, esophagus, bladder, kidney and pancreas decreases -risk of ulcer decreases  15 years -risk of heart disease is now similar to that of people who have never smoked -risk of death returns to nearly the level of people who have  never smoked   

## 2017-11-01 ENCOUNTER — Encounter: Payer: Self-pay | Admitting: Physician Assistant

## 2017-11-01 NOTE — Assessment & Plan Note (Signed)
COntinued counseling on smoking cessation.

## 2017-11-01 NOTE — Assessment & Plan Note (Signed)
Controlled. Continue current treatment. 

## 2017-11-10 ENCOUNTER — Telehealth: Payer: Self-pay | Admitting: Physician Assistant

## 2017-11-10 NOTE — Telephone Encounter (Signed)
Copied from CRM [id number]. Topic: Quick Communication - See Telephone Encounter >> Nov 10, 2017  9:34 AM Eston Mouldavis, Kody Brandl B wrote: CRM for notification. See Telephone encounter for:  Pt asking to speak to Dr Ramiro HarvestJeffreys nurse or Dr Tinnie GensJeffrey  about her medication (adderol),   pt really doesn't want to change dosage   11/10/17.

## 2017-11-14 NOTE — Telephone Encounter (Signed)
See below

## 2017-11-14 NOTE — Telephone Encounter (Signed)
Please advise 

## 2017-11-15 ENCOUNTER — Telehealth: Payer: Self-pay | Admitting: Physician Assistant

## 2017-11-15 NOTE — Telephone Encounter (Signed)
Please call this patient.  What is the issue with the Adderall?

## 2017-11-15 NOTE — Telephone Encounter (Signed)
Rec'd phone call from pt.  Requested she would like to know if Porfirio Oarhelle Jeffery, PA would allow her to change her Adderal dose.  She is requesting to take Adderal 20 mg tid, instead of Adderal 30 mg bid.  She is requesting to try taking a lower dose more frequently.  Also, stated that the extended release that she has tried in the past, has never worked for her.   The pt. also stated she will run out of her Adderal this weekend, and will need to pick up a refill by Friday, 11/23.  Advised will send message to Theora Gianottihelle Jeffrey, PA.  Pt. Agreed.

## 2017-11-17 NOTE — Telephone Encounter (Signed)
Please advise 

## 2017-11-18 MED ORDER — AMPHETAMINE-DEXTROAMPHETAMINE 20 MG PO TABS
20.0000 mg | ORAL_TABLET | Freq: Three times a day (TID) | ORAL | 0 refills | Status: DC
Start: 2017-11-18 — End: 2017-12-22

## 2017-11-18 NOTE — Telephone Encounter (Signed)
The patient called again 11/15/2017. See other message thread. Closing this encounter.

## 2017-11-18 NOTE — Telephone Encounter (Signed)
Rx sent electronically.  Meds ordered this encounter  Medications  . amphetamine-dextroamphetamine (ADDERALL) 20 MG tablet    Sig: Take 1 tablet (20 mg total) by mouth 3 (three) times daily.    Dispense:  90 tablet    Refill:  0    Order Specific Question:   Supervising Provider    Answer:   Clelia CroftSHAW, EVA N [[id number]]

## 2017-12-20 ENCOUNTER — Telehealth: Payer: Self-pay | Admitting: Physician Assistant

## 2017-12-20 NOTE — Telephone Encounter (Signed)
Copied from CRM [phone]#[id number]. Topic: Quick Communication - Rx Refill/Question >> Dec 20, 2017 12:56 PM Alexander BergeronBarksdale, Harvey B wrote: Pt called to get a refill on her amphetamine-dextroamphetamine (ADDERALL) 20 MG tablet [[id number]][[id number]] , contact pt to advise or pharmacy pt did state that she is completely out of the Rx

## 2017-12-21 NOTE — Telephone Encounter (Signed)
Please advice  

## 2017-12-22 MED ORDER — AMPHETAMINE-DEXTROAMPHETAMINE 20 MG PO TABS: 20.0000 mg | ORAL_TABLET | Freq: Three times a day (TID) | ORAL | 0 refills | Status: DC

## 2017-12-22 NOTE — Telephone Encounter (Unsigned)
Copied from CRM [id number]. Topic: Quick Communication - Rx Refill/Question >> Dec 22, 2017 11:34 AM Kim KalataMichael, Taylor L, NT wrote: Patient is calling back - she states she has not heard anything in regards to this medication and has not been able to take it for 2 days. Please advise

## 2017-12-22 NOTE — Telephone Encounter (Signed)
Rx sent electronically.  Meds ordered this encounter  Medications  . amphetamine-dextroamphetamine (ADDERALL) 20 MG tablet    Sig: Take 1 tablet (20 mg total) by mouth 3 (three) times daily.    Dispense:  90 tablet    Refill:  0    Order Specific Question:   Supervising Provider    Answer:   SHAW, EVA N [[id number]]    

## 2017-12-28 ENCOUNTER — Emergency Department (HOSPITAL_COMMUNITY)
Admission: EM | Admit: 2017-12-28 | Discharge: 2017-12-28 | Disposition: A | Discharge status: Home / Self Care | Payer: BLUE CROSS/BLUE SHIELD | Attending: Emergency Medicine | Admitting: Emergency Medicine

## 2017-12-28 ENCOUNTER — Encounter (HOSPITAL_COMMUNITY): Payer: Self-pay | Admitting: Emergency Medicine

## 2017-12-28 DIAGNOSIS — K047 Periapical abscess without sinus: Secondary | ICD-10-CM

## 2017-12-28 DIAGNOSIS — F17210 Nicotine dependence, cigarettes, uncomplicated: Secondary | ICD-10-CM | POA: Diagnosis not present

## 2017-12-28 DIAGNOSIS — J45909 Unspecified asthma, uncomplicated: Secondary | ICD-10-CM | POA: Insufficient documentation

## 2017-12-28 DIAGNOSIS — F909 Attention-deficit hyperactivity disorder, unspecified type: Secondary | ICD-10-CM | POA: Diagnosis not present

## 2017-12-28 DIAGNOSIS — R600 Localized edema: Secondary | ICD-10-CM | POA: Diagnosis not present

## 2017-12-28 DIAGNOSIS — J0100 Acute maxillary sinusitis, unspecified: Secondary | ICD-10-CM | POA: Insufficient documentation

## 2017-12-28 DIAGNOSIS — R0981 Nasal congestion: Secondary | ICD-10-CM | POA: Diagnosis not present

## 2017-12-28 MED ORDER — AMOXICILLIN-POT CLAVULANATE 875-125 MG PO TABS: 1.0000 | ORAL_TABLET | Freq: Two times a day (BID) | ORAL | 0 refills | Status: AC

## 2017-12-28 MED ORDER — FLUTICASONE PROPIONATE 50 MCG/ACT NA SUSP: 1.0000 | Freq: Every day | NASAL | 0 refills | Status: AC

## 2017-12-28 NOTE — ED Provider Notes (Signed)
[facility]-EMERGENCY DEPT Provider Note   CSN: [id number] Arrival date & time: 12/28/17  0810     History   Chief Complaint Chief Complaint  Patient presents with  . Facial Swelling    HPI Kim Freeman is a 37 y.o. female.  HPI 37 year old female with past medical history as below who presents with left facial swelling.  Patient states that for the last 2 days, she has had mild nasal congestion and sinus fullness.  She denies any fevers.  She has felt mildly more fatigued.  Upon awakening this morning, she notes swelling to the left side of her face with mild tenderness of her left upper teeth.  Pain is worse with eating.  Denies any redness of the face.  Denies any difficulty swallowing.  Denies any tongue swelling.  No oral lesions.  Denies any ear pain or fullness.  No tinnitus.  No neck stiffness.  No cough.  No shortness of breath.  Denies specific known sick contacts but she is around multiple children.  She is currently breast-feeding.  Past Medical History:  Diagnosis Date  . ADHD   . ADHD   . Asthma    exercise, bronchitis induced; not on inhaler currently  . Broken ribs   . Fracture, hip (HCC)   . Late prenatal care   . SVD (spontaneous vaginal delivery) 07/04/2017  . SVD (spontaneous vaginal delivery) 07/04/2017  . Vaginal Pap smear, abnormal     Patient Active Problem List   Diagnosis Date Noted  . Attention deficit disorder 07/19/2017  . Smoker 07/18/2017    Past Surgical History:  Procedure Laterality Date  . CHEST TUBE INSERTION    . STYLOID PROCESS EXCISION      OB History    Gravida Para Term Preterm AB Living   3 3 3  0 0 3   SAB TAB Ectopic Multiple Live Births   0 0 0 0 3       Home Medications    Prior to Admission medications   Medication Sig Start Date End Date Taking? Authorizing Provider  amoxicillin-clavulanate (AUGMENTIN) 875-125 MG tablet Take 1 tablet by mouth every 12 (twelve) hours for 10 days. 12/28/17  01/07/18  Shaune Pollack, MD  amphetamine-dextroamphetamine (ADDERALL) 20 MG tablet Take 1 tablet (20 mg total) by mouth 3 (three) times daily. 12/22/17   Jeffery, Chelle, PA-C  fluticasone (FLONASE) 50 MCG/ACT nasal spray Place 1 spray into both nostrils daily for 14 days. 12/28/17 01/11/18  Shaune Pollack, MD    Family History Family History  Problem Relation Age of Onset  . Hypertension Mother   . Hyperlipidemia Mother   . Cancer Mother        SKIN, OVARIAN  . Hyperlipidemia Father   . Hypertension Father   . Drug abuse Father   . Bipolar disorder Father   . Depression Father   . Cancer Father        SKIN  . Hypertension Maternal Grandmother   . Diabetes Maternal Grandmother   . Hyperlipidemia Maternal Grandfather   . Hypertension Maternal Grandfather   . Diabetes Maternal Grandfather   . Schizophrenia Paternal Grandmother   . Hyperlipidemia Paternal Grandfather   . Hypertension Paternal Grandfather   . Cancer Paternal Grandfather   . Diabetes Paternal Grandfather     Social History Social History   Tobacco Use  . Smoking status: Current Every Day Smoker    Packs/day: 0.25    Years: 20.00    Pack years:  5.00    Types: Cigarettes  . Smokeless tobacco: Former Engineer, waterUser  Substance Use Topics  . Alcohol use: Yes    Comment: states she drinks whiskey most days  . Drug use: No     Allergies   Fentanyl   Review of Systems Review of Systems  Constitutional: Positive for fatigue. Negative for chills and fever.  HENT: Positive for congestion, dental problem, sinus pressure and sinus pain. Negative for rhinorrhea and sore throat.   Eyes: Negative for visual disturbance.  Respiratory: Negative for cough, shortness of breath and wheezing.   Cardiovascular: Negative for chest pain and leg swelling.  Gastrointestinal: Negative for abdominal pain, diarrhea, nausea and vomiting.  Genitourinary: Negative for dysuria, flank pain, vaginal bleeding and vaginal discharge.    Musculoskeletal: Negative for neck pain.  Skin: Negative for rash.  Allergic/Immunologic: Negative for immunocompromised state.  Neurological: Negative for syncope and headaches.  Hematological: Does not bruise/bleed easily.  All other systems reviewed and are negative.    Physical Exam Updated Vital Signs BP 136/86 (BP Location: Left Arm)   Pulse 79   Temp 98.3 F (36.8 C) (Oral)   Resp 18   LMP 12/12/2017   SpO2 96%   Physical Exam  Constitutional: She is oriented to person, place, and time. She appears well-developed and well-nourished. No distress.  HENT:  Head: Normocephalic and atraumatic.  Mild asymmetric swelling of the left maxillary cheek.  There is swelling and tenderness over the gingiva of the left upper canine.  No obvious gingival abscess.  Moderate tenderness with palpation.  There are active, diffuse dental caries with poor dentition.  No sublingual swelling or edema.  Tongue protrusion is normal.  Mild tenderness to percussion of the left maxillary sinus.  No erythema around the eyes.  Full, painless range of motion.  No proptosis.  Tympanic membranes normal bilaterally.  Eyes: Conjunctivae are normal.  Neck: Neck supple.  Cardiovascular: Normal rate, regular rhythm and normal heart sounds. Exam reveals no friction rub.  No murmur heard. Pulmonary/Chest: Effort normal and breath sounds normal. No respiratory distress. She has no wheezes. She has no rales.  Abdominal: She exhibits no distension.  Musculoskeletal: She exhibits no edema.  Neurological: She is alert and oriented to person, place, and time. She exhibits normal muscle tone.  Skin: Skin is warm. Capillary refill takes less than 2 seconds.  Psychiatric: She has a normal mood and affect.  Nursing note and vitals reviewed.    ED Treatments / Results  Labs (all labs ordered are listed, but only abnormal results are displayed) Labs Reviewed - No data to display  EKG  EKG Interpretation None        Radiology No results found.  Procedures Procedures (including critical care time)  Medications Ordered in ED Medications - No data to display   Initial Impression / Assessment and Plan / ED Course  I have reviewed the triage vital signs and the nursing notes.  Pertinent labs & imaging results that were available during my care of the patient were reviewed by me and considered in my medical decision making (see chart for details).     37 year old female with past medical history as above who presents with facial swelling and sinus pressure.  I suspect the patient has a likely odontogenic infection with possible early, mild maxillary sinusitis.  There is no evidence of facial erythema or obvious facial abscess.  There is no involvement of the orbits.  Nasopharynx is clear.  She is afebrile  and systemically well-appearing.  She is not immunosuppressed.  Will place her on Augmentin, start Flonase, and discharge with good return precautions.  Final Clinical Impressions(s) / ED Diagnoses   Final diagnoses:  Acute non-recurrent maxillary sinusitis  Dental infection    ED Discharge Orders        Ordered    amoxicillin-clavulanate (AUGMENTIN) 875-125 MG tablet  Every 12 hours     12/28/17 0838    fluticasone (FLONASE) 50 MCG/ACT nasal spray  Daily     12/28/17 1610       Shaune Pollack, MD 12/28/17 [id number]

## 2017-12-28 NOTE — ED Triage Notes (Signed)
Patient reports that yesterday felt pressure on left side of face then today woke up with swelling. Denies pain like when she has had dental issues that caused facial swelling.

## 2018-01-17 ENCOUNTER — Telehealth: Payer: Self-pay | Admitting: Physician Assistant

## 2018-01-17 MED ORDER — AMPHETAMINE-DEXTROAMPHETAMINE 20 MG PO TABS: 20.0000 mg | ORAL_TABLET | Freq: Three times a day (TID) | ORAL | 0 refills | Status: DC

## 2018-01-17 NOTE — Telephone Encounter (Signed)
Copied from CRM [phone]. Topic: Quick Communication - Rx Refill/Question >> Jan 17, 2018  9:34 AM Rudi CocoLathan, Scarlett Portlock M, NT wrote: Medication:Adderal   Has the patient contacted their pharmacy? yes  (Agent: If no, request that the patient contact the pharmacy for the refill.)   Preferred Pharmacy (with phone number or street name): [id number] [place] [address] [facility] [address] [place] [address] Phone: [phone] Fax: [phone]     Agent: Please be advised that RX refills may take up to 3 business days. We ask that you follow-up with your pharmacy.

## 2018-01-17 NOTE — Telephone Encounter (Signed)
Rx sent electronically.  Meds ordered this encounter  Medications  . amphetamine-dextroamphetamine (ADDERALL) 20 MG tablet    Sig: Take 1 tablet (20 mg total) by mouth 3 (three) times daily.    Dispense:  90 tablet    Refill:  0    Order Specific Question:   Supervising Provider    Answer:   SHAW, EVA N [[id number]]    

## 2018-01-17 NOTE — Telephone Encounter (Signed)
Please advise 

## 2018-01-18 NOTE — Telephone Encounter (Signed)
Since I wrote the prescription, I think it's odd to ask for a letter from me authorizing the prescription. Please call the pharmacy to inquire about this situation. If they verify the need for a letter from me, authorizing the prescription that I wrote, please write it.

## 2018-01-18 NOTE — Telephone Encounter (Signed)
Patient called and stated that the pharmacy told her that medicaid denied her script because of the dosage. She said they need a fax sent over from the Dr stating that it is dr approved, so she can pick that up. Please advise.   [facility] [address]

## 2018-01-18 NOTE — Telephone Encounter (Signed)
Please advise. Okay to write note?

## 2018-01-19 NOTE — Telephone Encounter (Signed)
Called the pharmacy and they state the pt was able to pick up her Rx today. Pharmacy stated they were trying to bill two different insurances and that was what the problem was.

## 2018-02-14 ENCOUNTER — Telehealth: Payer: Self-pay | Admitting: Physician Assistant

## 2018-02-14 MED ORDER — AMPHETAMINE-DEXTROAMPHETAMINE 20 MG PO TABS: 20.0000 mg | ORAL_TABLET | Freq: Three times a day (TID) | ORAL | 0 refills | Status: DC

## 2018-02-14 NOTE — Telephone Encounter (Signed)
Copied from CRM [phone]. Topic: Quick Communication - Rx Refill/Question >> Feb 14, 2018  9:50 AM Darletta MollLander, Lumin L wrote: Medication:  amphetamine-dextroamphetamine (ADDERALL) 20 MG tablet   Has the patient contacted their pharmacy? No. (CONTROLLED SUB)  (Agent: If no, request that the patient contact the pharmacy for the refill.)  Preferred Pharmacy (with phone number or street name): [id number] - [place], [place] - [address] [facility] [address]  Agent: Please be advised that RX refills may take up to 3 business days. We ask that you follow-up with your pharmacy.  Patient will be out of medication tomorrow. Has a newborn.

## 2018-02-14 NOTE — Telephone Encounter (Signed)
Rx sent electronically.  Meds ordered this encounter  Medications  . amphetamine-dextroamphetamine (ADDERALL) 20 MG tablet    Sig: Take 1 tablet (20 mg total) by mouth 3 (three) times daily.    Dispense:  90 tablet    Refill:  0    Order Specific Question:   Supervising Provider    Answer:   SHAW, EVA N [[id number]]    

## 2018-02-14 NOTE — Telephone Encounter (Signed)
[facility]

## 2018-02-16 NOTE — Telephone Encounter (Signed)
Caller Name: Roxine CaddyShawn Cavitt Phone: [phone] Pharmacy:[id number] - [place], [place] - [address] [facility] [address] [facility] [address]  Pharmacy stating medicaid rejected filling adderall due to the dose change. Pharmacy advised pt they need provider to call to authorize the dose.

## 2018-02-22 NOTE — Telephone Encounter (Signed)
Spoke with pt.  She states Medicaid is asking for provider to call and authorize the dose, even though this was done a month ago.  Pt states she has BC also and [facility].  She will call her case worker to see what the issue is with Medicaid.

## 2018-02-22 NOTE — Telephone Encounter (Signed)
I'm unclear what I need to do here. Do we need to do a prior authorization?

## 2018-02-27 ENCOUNTER — Ambulatory Visit: Payer: BLUE CROSS/BLUE SHIELD | Admitting: Physician Assistant

## 2018-02-28 ENCOUNTER — Encounter: Payer: Self-pay | Admitting: Physician Assistant

## 2018-02-28 ENCOUNTER — Ambulatory Visit (INDEPENDENT_AMBULATORY_CARE_PROVIDER_SITE_OTHER): Payer: BLUE CROSS/BLUE SHIELD | Admitting: Physician Assistant

## 2018-02-28 ENCOUNTER — Other Ambulatory Visit: Payer: Self-pay

## 2018-02-28 VITALS — BP 116/78 | HR 82 | Temp 98.2°F | Resp 16 | Ht 66.0 in | Wt 193.0 lb

## 2018-02-28 DIAGNOSIS — F329 Major depressive disorder, single episode, unspecified: Secondary | ICD-10-CM

## 2018-02-28 DIAGNOSIS — F32A Depression, unspecified: Secondary | ICD-10-CM | POA: Insufficient documentation

## 2018-02-28 DIAGNOSIS — F17200 Nicotine dependence, unspecified, uncomplicated: Secondary | ICD-10-CM | POA: Diagnosis not present

## 2018-02-28 DIAGNOSIS — F988 Other specified behavioral and emotional disorders with onset usually occurring in childhood and adolescence: Secondary | ICD-10-CM | POA: Diagnosis not present

## 2018-02-28 MED ORDER — SERTRALINE HCL 50 MG PO TABS: 50.0000 mg | ORAL_TABLET | Freq: Every day | ORAL | 3 refills | Status: DC

## 2018-02-28 NOTE — Progress Notes (Signed)
Subjective:    Patient ID: Kim Freeman, female    DOB: [birth year], 37 y.o.   MRN: [id number]   Chief Complaint  Patient presents with  . ADHD    adderall, discuss mood stabilizer    HPI Patient presents for re-evaluation of ADHD and initial evaluation of mood. Patient is currently taking adderall (instant release 20 mg PO TID,) and finds that it is not adequate to keep her focused. Patient reports that she has tried extended release and that it "doesn't work for her," and that it "made her feel funny." Patient reports to feeling depressed and hopeless often. She also reports a decreased interest in doing things she once loved. Patient wants to quit smoking cigarettes. Patient currently smokes 1 pack/day. She has tried Wellbutrin in the past, and states that she "doesn't want to go back on it." She cannot recall any specific side effects that deter her use of it, but affirms that she "does not want to take it again." Patient would like to try Lamictal for her mood. One of her best friends who she describes as "crazy," uses Lamictal as a moof stabilizer and she is interested in trying it. She does NOT want to try Effexor as her mother is one it (and despite being on it for 10 years and swearing by it,) gets "brain zaps" when she misses a dose. Patient does not want to one day try wean off and get aforementioned "brain zaps." Patient has never tried an SSRI but states that she "doesn't want to be tired."  Patient Active Problem List   Diagnosis Date Noted  . Depression 02/28/2018  . Attention deficit disorder 07/19/2017  . Smoker 07/18/2017   Allergies  Allergen Reactions  . Fentanyl Itching   Prior to Admission medications   Medication Sig Start Date End Date Taking? Authorizing Provider  amphetamine-dextroamphetamine (ADDERALL) 20 MG tablet Take 1 tablet (20 mg total) by mouth 3 (three) times daily. 02/14/18  Yes Jeffery, Chelle, PA-C  fluticasone (FLONASE) 50 MCG/ACT nasal spray Place 1  spray into both nostrils daily for 14 days. 12/28/17 01/11/18  Shaune Pollack, MD   Past Medical History:  Diagnosis Date  . ADHD   . ADHD   . Asthma    exercise, bronchitis induced; not on inhaler currently  . Broken arm   . Broken ribs   . Fracture, hip (HCC)   . Late prenatal care   . SVD (spontaneous vaginal delivery) 07/04/2017  . SVD (spontaneous vaginal delivery) 07/04/2017  . Vaginal Pap smear, abnormal    Social History   Socioeconomic History  . Marital status: Married    Spouse name: Viviann Spare  . Number of children: 3  . Years of education: GED  . Highest education level: Not on file  Social Needs  . Financial resource strain: Not on file  . Food insecurity - worry: Not on file  . Food insecurity - inability: Not on file  . Transportation needs - medical: Not on file  . Transportation needs - non-medical: Not on file  Occupational History  . 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 Smoking status: Current Every Day Smoker    Packs/day: 1.00    Years: 20.00    Pack years: 20.00    Types: Cigarettes  . Smokeless tobacco: Former Engineer, water and Sexual Activity  . Alcohol use: Yes    Comment: occasionally  . Drug use: No  .  Sexual activity: Not Currently    Birth control/protection: None  Other Topics Concern  . Not on file  Social History Narrative   Lives with her 3 daughters.   Father lives nearby, not legally separated but separated.   Brother is Emergency planning/management officer for the [employer].   Family History  Problem Relation Age of Onset  . Hypertension Mother   . Hyperlipidemia Mother   . Cancer Mother        SKIN, OVARIAN  . Hyperlipidemia Father   . Hypertension Father   . Drug abuse Father   . Bipolar disorder Father   . Depression Father   . Cancer Father        SKIN  . Hypertension Maternal Grandmother   . Diabetes Maternal Grandmother   . Hyperlipidemia Maternal Grandfather   .  Hypertension Maternal Grandfather   . Diabetes Maternal Grandfather   . Schizophrenia Paternal Grandmother   . Bipolar disorder Paternal Grandmother   . Hyperlipidemia Paternal Grandfather   . Hypertension Paternal Grandfather   . Cancer Paternal Grandfather   . Diabetes Paternal Grandfather    Past Surgical History:  Procedure Laterality Date  . CHEST TUBE INSERTION    . STYLOID PROCESS EXCISION      Review of Systems  Constitutional: Positive for fatigue. Negative for activity change and appetite change.  HENT: Negative.  Negative for congestion and sore throat.        "No more than regular allergies."  Eyes: Negative.   Respiratory: Negative.  Negative for chest tightness and shortness of breath.   Cardiovascular: Negative.  Negative for chest pain and palpitations.  Gastrointestinal: Negative.  Negative for constipation and diarrhea.  Endocrine: Negative.  Negative for polydipsia and polyuria.  Genitourinary: Negative.  Negative for difficulty urinating and urgency.  Musculoskeletal: Negative.  Negative for arthralgias and joint swelling.  Skin: Negative.  Negative for rash.  Allergic/Immunologic: Positive for environmental allergies.  Neurological: Negative.  Negative for dizziness, light-headedness and headaches.  Psychiatric/Behavioral: Positive for decreased concentration and dysphoric mood. The patient is nervous/anxious.        Objective:   Physical Exam  Constitutional: She is oriented to person, place, and time. She appears well-developed and well-nourished.  BP 116/78   Pulse 82   Temp 98.2 F (36.8 C)   Resp 16   Ht 5\' 6"  (1.676 m)   Wt 193 lb (87.5 kg)   BMI 31.15 kg/m   HENT:  Head: Normocephalic and atraumatic.  Right Ear: External ear normal.  Left Ear: External ear normal.  Nose: Nose normal.  Mouth/Throat: Oropharynx is clear and moist.  Eyes: Conjunctivae and EOM are normal. Pupils are equal, round, and reactive to light.  Neck: Normal range of  motion. Neck supple.  Cardiovascular: Normal rate, regular rhythm, normal heart sounds and intact distal pulses.  Pulmonary/Chest: Effort normal and breath sounds normal.  Abdominal: Soft. Bowel sounds are normal.  Musculoskeletal: Normal range of motion.  Neurological: She is alert and oriented to person, place, and time. She has normal reflexes.  Skin: Skin is warm and dry. No rash noted. No pallor.  Psychiatric: She has a normal mood and affect. Her behavior is normal. Judgment and thought content normal.   Depression screen [facility] 2/9 02/28/2018  Decreased Interest 2  Down, Depressed, Hopeless 2  PHQ - 2 Score 4  Altered sleeping 0  Tired, decreased energy 1  Change in appetite 0  Feeling bad or failure about yourself  3  Trouble concentrating 2  Moving slowly or fidgety/restless 1  Suicidal thoughts 0  PHQ-9 Score 11  Difficult doing work/chores Extremely dIfficult      Assessment & Plan:   1. Attention deficit disorder, unspecified hyperactivity presence Patient's lack of focus may be due to her state of depression. Prior to changing patient's Adderall dose, medical intervention for mood will be attempted. Zoloft was added to patient's medication regime to improve her symptoms of depression.   2. Depression, unspecified depression type Patient reports to feeling down, depressed and hopeless for over half of the days in the last two weeks. She also reports decreased interest in doing things she used to enjoy. Patient also reports inability to focus. PHQ-9 was done today and patient's score was 11.Patient's inability to focus may be improved with treatment of depressive symptoms. Zoloft was added to medication regime to help manage depressive symptoms.  - sertraline (ZOLOFT) 50 MG tablet; Take 1 tablet (50 mg total) by mouth daily.  Dispense: 30 tablet; Refill: 3  3. Smoker Patient wants to quit smoking cigarettes. Patient currently smokes 1 pack/day. She has tried Wellbutrin in the  past, and states that she "doesn't want to go back on it." She cannot recall any specific side effects that deter her use of it, but affirms that she "does not want to take it again." Interventions to enable smoking cessation will be re-addressed at follow up appointment.  Return in about 4 weeks (around 03/28/2018) for re-evalaution of mood and ADHD.

## 2018-02-28 NOTE — Assessment & Plan Note (Signed)
Trial of sertraline. Would consider Pristiq, though she has concern about venlafaxine, so would proceed with caution with desvenlafaxine.

## 2018-02-28 NOTE — Progress Notes (Signed)
Patient ID: Kim Freeman, female    DOB: 1981/09/28, 37 y.o.   MRN: [id number]  PCP: Porfirio Oar, PA-C  Chief Complaint  Patient presents with  . ADHD    adderall, discuss mood stabilizer     Subjective:   Presents for evaluation of ADHD.  She has been doing well on Adderall 20 mg 3 times daily but notes that recently it has not been adequate to keep her focused.  Recall that extended release formulation has not worked for her in the past and caused her to "feel funny."  She describes having decreased interest in doing things she once enjoyed.  More feelings of depression and hopelessness.  1 of her friends describes her as "crazy" and recommended lamotrigine as a mood stabilizer.  She desires treatment for her depression at this time.  Has tried Wellbutrin in the past and does not want to resume at a though she cannot recall any specific adverse effects.  In addition, she is not interested in venlafaxine due to "brain zaps" that a friend experienced when she missed a dose and when she tried to stop it.  It is of note however that her mother uses 1 vaccine with excellent results for the past 10 years.  She has never tried a selective serotonin reuptake inhibitor.  Does not want to take medication but will make her feel tired.  She is the mother of 3 daughters, the youngest, and infant, is with her today.  She is ready to quit smoking.    Review of Systems Constitutional: Positive for fatigue. Negative for activity change and appetite change.  HENT: Negative.  Negative for congestion and sore throat.        "No more than regular allergies."  Eyes: Negative.   Respiratory: Negative.  Negative for chest tightness and shortness of breath.   Cardiovascular: Negative.  Negative for chest pain and palpitations.  Gastrointestinal: Negative.  Negative for constipation and diarrhea.  Endocrine: Negative.  Negative for polydipsia and polyuria.  Genitourinary: Negative.  Negative for  difficulty urinating and urgency.  Musculoskeletal: Negative.  Negative for arthralgias and joint swelling.  Skin: Negative.  Negative for rash.  Allergic/Immunologic: Positive for environmental allergies.  Neurological: Negative.  Negative for dizziness, light-headedness and headaches.  Psychiatric/Behavioral: Positive for decreased concentration and dysphoric mood. The patient is nervous/anxious.    Depression screen [facility] 2/9 02/28/2018 02/28/2018 10/20/2017 07/18/2017  Decreased Interest 2 0 0 0  Down, Depressed, Hopeless 2 0 0 0  PHQ - 2 Score 4 0 0 0  Altered sleeping 0 - - -  Tired, decreased energy 1 - - -  Change in appetite 0 - - -  Feeling bad or failure about yourself  3 - - -  Trouble concentrating 2 - - -  Moving slowly or fidgety/restless 1 - - -  Suicidal thoughts 0 - - -  PHQ-9 Score 11 - - -  Difficult doing work/chores Extremely dIfficult - - -       Patient Active Problem List   Diagnosis Date Noted  . Attention deficit disorder 07/19/2017  . Smoker 07/18/2017     Prior to Admission medications   Medication Sig Start Date End Date Taking? Authorizing Provider  amphetamine-dextroamphetamine (ADDERALL) 20 MG tablet Take 1 tablet (20 mg total) by mouth 3 (three) times daily. 02/14/18  Yes Real Cona, PA-C  fluticasone (FLONASE) 50 MCG/ACT nasal spray Place 1 spray into both nostrils daily for 14 days. 12/28/17 01/11/18  Shaune PollackIsaacs, Cameron, MD     Allergies  Allergen Reactions  . Fentanyl Itching       Objective:  Physical Exam  Constitutional: She is oriented to person, place, and time. She appears well-developed and well-nourished. She is active and cooperative. No distress.  BP 116/78   Pulse 82   Temp 98.2 F (36.8 C)   Resp 16   Ht 5\' 6"  (1.676 m)   Wt 193 lb (87.5 kg)   BMI 31.15 kg/m   HENT:  Head: Normocephalic and atraumatic.  Right Ear: Hearing normal.  Left Ear: Hearing normal.  Eyes: Conjunctivae are normal. No scleral icterus.  Neck:  Normal range of motion. Neck supple. No thyromegaly present.  Cardiovascular: Normal rate, regular rhythm and normal heart sounds.  Pulses:      Radial pulses are 2+ on the right side, and 2+ on the left side.  Pulmonary/Chest: Effort normal and breath sounds normal.  Lymphadenopathy:       Head (right side): No tonsillar, no preauricular, no posterior auricular and no occipital adenopathy present.       Head (left side): No tonsillar, no preauricular, no posterior auricular and no occipital adenopathy present.    She has no cervical adenopathy.       Right: No supraclavicular adenopathy present.       Left: No supraclavicular adenopathy present.  Neurological: She is alert and oriented to person, place, and time. No sensory deficit.  Skin: Skin is warm, dry and intact. No rash noted. No cyanosis or erythema. Nails show no clubbing.  Psychiatric: Her speech is normal and behavior is normal. Judgment and thought content normal. Her mood appears not anxious. Her affect is labile. Her affect is not angry, not blunt and not inappropriate. Cognition and memory are normal. She exhibits a depressed mood. She expresses no homicidal and no suicidal ideation. She expresses no suicidal plans and no homicidal plans.    Wt Readings from Last 3 Encounters:  02/28/18 193 lb (87.5 kg)  10/20/17 189 lb (85.7 kg)  07/18/17 186 lb 9.6 oz (84.6 kg)       Assessment & Plan:   Problem List Items Addressed This Visit    Smoker    She is ready to quit smoking. Not interested in Wellbutrin, having tried it in the past, but does not recall any adverse effects. Sertraline, started for her mood, may help. She also thinks she may be able to quit "cold [place]key"      Attention deficit disorder - Primary    Unclear if the reduced effectiveness is due to inadequate Adderall dose or increased depression. Start sertraline for her mood.  Reassess in 4 weeks.  I suspect that mood is the underlying issue here.       Depression    Trial of sertraline. Would consider Pristiq, though she has concern about venlafaxine, so would proceed with caution with desvenlafaxine.      Relevant Medications   sertraline (ZOLOFT) 50 MG tablet       Return in about 4 weeks (around 03/28/2018) for re-evalaution of mood and ADHD.   Fernande Brashelle S. Ardit Danh, PA-C Primary Care at [facility]

## 2018-02-28 NOTE — Patient Instructions (Addendum)
When you start the sertraline, take 1/2 tablet daily for the first week, then increase to the whole tablet each day.    IF you received an x-ray today, you will receive an invoice from [facility]. Please contact [facility] at [phone] with questions or concerns regarding your invoice.   IF you received labwork today, you will receive an invoice from [facility]. Please contact [facility] at [phone] with questions or concerns regarding your invoice.   Our billing staff will not be able to assist you with questions regarding bills from these companies.  You will be contacted with the lab results as soon as they are available. The fastest way to get your results is to activate your My Chart account. Instructions are located on the last page of this paperwork. If you have not heard from us regarding the results in 2 weeks, please contact this office.

## 2018-03-01 NOTE — Assessment & Plan Note (Signed)
Unclear if the reduced effectiveness is due to inadequate Adderall dose or increased depression. Start sertraline for her mood.  Reassess in 4 weeks.  I suspect that mood is the underlying issue here.

## 2018-03-01 NOTE — Assessment & Plan Note (Signed)
She is ready to quit smoking. Not interested in Wellbutrin, having tried it in the past, but does not recall any adverse effects. Sertraline, started for her mood, may help. She also thinks she may be able to quit "cold [place]"

## 2018-03-19 ENCOUNTER — Other Ambulatory Visit: Payer: Self-pay | Admitting: Physician Assistant

## 2018-03-19 NOTE — Telephone Encounter (Signed)
Copied from CRM [phone]. Topic: Quick Communication - Rx Refill/Question >> Mar 19, 2018  9:27 AM Jolayne Hainesaylor, Brittany L wrote: Medication: amphetamine-dextroamphetamine (ADDERALL) 20 MG tablet  Has the patient contacted their pharmacy? no (Agent: If no, request that the patient contact the pharmacy for the refill.) Preferred Pharmacy (with phone number or street name): [id number] - [place] - [address] [facility] [address] Agent: Please be advised that RX refills may take up to 3 business days. We ask that you follow-up with your pharmacy.

## 2018-03-19 NOTE — Telephone Encounter (Signed)
LOV: 02/28/18  Chelle Jeffery,PA  [facility] [address]

## 2018-03-20 MED ORDER — AMPHETAMINE-DEXTROAMPHETAMINE 20 MG PO TABS: 20.0000 mg | ORAL_TABLET | Freq: Three times a day (TID) | ORAL | 0 refills | Status: DC

## 2018-03-29 ENCOUNTER — Encounter: Payer: Self-pay | Admitting: Physician Assistant

## 2018-04-03 ENCOUNTER — Ambulatory Visit: Payer: BLUE CROSS/BLUE SHIELD | Admitting: Physician Assistant

## 2018-04-03 ENCOUNTER — Encounter: Payer: Self-pay | Admitting: Physician Assistant

## 2018-04-03 ENCOUNTER — Other Ambulatory Visit: Payer: Self-pay

## 2018-04-03 VITALS — BP 110/80 | HR 92 | Temp 98.0°F | Resp 16 | Ht 66.0 in | Wt 193.0 lb

## 2018-04-03 DIAGNOSIS — F17200 Nicotine dependence, unspecified, uncomplicated: Secondary | ICD-10-CM

## 2018-04-03 DIAGNOSIS — F32A Depression, unspecified: Secondary | ICD-10-CM

## 2018-04-03 DIAGNOSIS — F988 Other specified behavioral and emotional disorders with onset usually occurring in childhood and adolescence: Secondary | ICD-10-CM | POA: Diagnosis not present

## 2018-04-03 DIAGNOSIS — F329 Major depressive disorder, single episode, unspecified: Secondary | ICD-10-CM | POA: Diagnosis not present

## 2018-04-03 MED ORDER — BUPROPION HCL ER (XL) 150 MG PO TB24: 150.0000 mg | ORAL_TABLET | Freq: Every day | ORAL | 3 refills | Status: AC

## 2018-04-03 NOTE — Assessment & Plan Note (Signed)
Did not tolerate sertraline. Previously did not tolerate bupropion, but willing to retry, this time the XL formulation.

## 2018-04-03 NOTE — Patient Instructions (Signed)
     IF you received an x-ray today, you will receive an invoice from [facility]. Please contact [facility] at [phone] with questions or concerns regarding your invoice.   IF you received labwork today, you will receive an invoice from [facility]. Please contact [facility] at [phone] with questions or concerns regarding your invoice.   Our billing staff will not be able to assist you with questions regarding bills from these companies.  You will be contacted with the lab results as soon as they are available. The fastest way to get your results is to activate your My Chart account. Instructions are located on the last page of this paperwork. If you have not heard from us regarding the results in 2 weeks, please contact this office.     

## 2018-04-03 NOTE — Progress Notes (Signed)
Patient ID: Kim Freeman, female    DOB: 02/06/1981, 37 y.o.   MRN: [id number]  PCP: Porfirio OarJeffery, Tige Meas, PA-C  Chief Complaint  Patient presents with  . ADHD    follow up     Subjective:   Presents for evaluation of ADHD and depressed mood.  At her visit 4 weeks ago,. We added sertraline, which she stopped after 2 weeks due to feeling worse.  "It killed my personality and made me numb."  Those symptoms resolved immediately upon discontinuation of sertraline.  Also in the interim, her house is in foreclosure. Her estranged husband has stopped paying the mortgage, and did not tell her. Only $20,000 is owed on the home, and she is in conversation with her mother about buying it.  She is in communication with her father again, though they have significant differences.  She has increased Adderall from TID to QID on her own, trying to get through all that she has on her plate, but notes that she feels overstimulated.  Wants to quit smoking.  Review of Systems  Dep[facility] 2/9 04/03/2018 02/28/2018 02/28/2018 10/20/2017 07/18/2017  Decreased Interest 1 2 0 0 0  Down, Depressed, Hopeless 2 2 0 0 0  PHQ - 2 Score 3 4 0 0 0  Altered sleeping 2 0 - - -  Tired, decreased energy 2 1 - - -  Change in appetite 3 0 - - -  Feeling bad or failure about yourself  3 3 - - -  Trouble concentrating 2 2 - - -  Moving slowly or fidgety/restless 0 1 - - -  Suicidal thoughts 0 0 - - -  PHQ-9 Score 15 11 - - -  Difficult doing work/chores Extremely dIfficult Extremely dIfficult - - -      Patient Active Problem List   Diagnosis Date Noted  . Depression 02/28/2018  . Attention deficit disorder 07/19/2017  . Smoker 07/18/2017     Prior to Admission medications   Medication Sig Start Date End Date Taking? Authorizing Provider  amphetamine-dextroamphetamine (ADDERALL) 20 MG tablet Take 1 tablet (20 mg total) by mouth 3 (three) times daily. 03/20/18  Yes Maizy Davanzo, PA-C    fluticasone (FLONASE) 50 MCG/ACT nasal spray Place 1 spray into both nostrils daily for 14 days. 12/28/17 01/11/18  Shaune PollackIsaacs, Cameron, MD  sertraline (ZOLOFT) 50 MG tablet Take 1 tablet (50 mg total) by mouth daily. Patient not taking: Reported on 04/03/2018 02/28/18   Porfirio OarJeffery, Jeremias Broyhill, PA-C     Allergies  Allergen Reactions  . Fentanyl Itching       Objective:  Physical Exam  Constitutional: She is oriented to person, place, and time. She appears well-developed and well-nourished. She is active and cooperative. No distress.  BP 110/80   Pulse 92   Temp 98 F (36.7 C)   Resp 16   Ht 5\' 6"  (1.676 m)   Wt 193 lb (87.5 kg)   SpO2 97%   BMI 31.15 kg/m    Eyes: Conjunctivae are normal.  Pulmonary/Chest: Effort normal.  Neurological: She is alert and oriented to person, place, and time.  Psychiatric: She has a normal mood and affect. Her speech is normal and behavior is normal.    Wt Readings from Last 3 Encounters:  04/03/18 193 lb (87.5 kg)  02/28/18 193 lb (87.5 kg)  10/20/17 189 lb (85.7 kg)      Assessment & Plan:   Problem List Items Addressed This Visit  Smoker    Encouraged smoking cessation. Bupropion for mood and ADHD may help this issue, as well.      Attention deficit disorder - Primary    Reduce Adderall back to prescribed frequency of TID. Hope to reduce further once stressors are resolved and mood is improved. Add bupropion XL 150 mg.      Relevant Medications   buPROPion (WELLBUTRIN XL) 150 MG 24 hr tablet   Depression    Did not tolerate sertraline. Previously did not tolerate bupropion, but willing to retry, this time the XL formulation.      Relevant Medications   buPROPion (WELLBUTRIN XL) 150 MG 24 hr tablet       Return in about 1 month (around 05/01/2018) for re-evaluation of mood and attention.   Fernande Bras, PA-C Primary Care at [facility]

## 2018-04-03 NOTE — Assessment & Plan Note (Signed)
Encouraged smoking cessation. Bupropion for mood and ADHD may help this issue, as well.

## 2018-04-03 NOTE — Progress Notes (Signed)
   Subjective:    Patient ID: Kim Freeman, female    DOB: 02/26/1981, 37 y.o.   MRN: [id number] Chief Complaint  Patient presents with  . ADHD    follow up     HPI   Reports Zoloft made her symptoms worse. Reports it "killed my personality and made me numb". Reports being off zoloft for about 2 weeks now and immediately felt better. Reports only benefit was able to watch TV without being distracted.   Reports house is in foreclosure due to ex-husband not paying the bill. Reports upping her Adderall on her own. For about a week she was taking about 4 Adderall 20mg /day. Reports doing well on Adderall. Has no problems with appetite or sleep other than waking up in the middle of the night (has a baby). Requesting increase in dose due to inability to focus within 2 hrs of taking medication.  Has a lot of stress due to the foreclosure and trying to save her house.   Denies thoughts of suicide or hurting herself or others.   Review of Systems  Constitutional: Negative for activity change, appetite change, chills, diaphoresis, fatigue, fever and unexpected weight change.  HENT: Negative.   Eyes: Negative.   Respiratory: Negative.   Cardiovascular: Negative.   Gastrointestinal: Negative.   Endocrine: Negative.   Genitourinary: Negative.   Musculoskeletal: Negative.   Allergic/Immunologic: Negative.   Neurological: Negative.   Hematological: Negative.   Psychiatric/Behavioral: Negative.        Objective:   Physical Exam        Assessment & Plan:  1. Attention deficit disorder, unspecified hyperactivity presence

## 2018-04-03 NOTE — Assessment & Plan Note (Signed)
Reduce Adderall back to prescribed frequency of TID. Hope to reduce further once stressors are resolved and mood is improved. Add bupropion XL 150 mg.

## 2018-04-18 ENCOUNTER — Other Ambulatory Visit: Payer: Self-pay | Admitting: Physician Assistant

## 2018-04-18 NOTE — Telephone Encounter (Signed)
Copied from CRM [phone]. Topic: Quick Communication - Rx Refill/Question >> Apr 18, 2018  4:30 PM Rudi CocoLathan, Brix Brearley M, [place] wrote: Medication:amphetamine-dextroamphetamine (ADDERALL) 20 MG tablet [[id number] Has the patient contacted their pharmacy? no (Agent: If no, request that the patient contact the pharmacy for the refill.) Preferred Pharmacy (with phone number or street name): [id number] [place] - [address] [facility] [address] [place] [address] Phone: [phone] Fax: [phone]   Agent: Please be advised that RX refills may take up to 3 business days. We ask that you follow-up with your pharmacy.

## 2018-04-20 NOTE — Telephone Encounter (Signed)
Pt calling to check status on having her amphetamine-dextroamphetamine (ADDERALL) 20 MG tablet Refilled. She is completely out.

## 2018-04-20 NOTE — Telephone Encounter (Signed)
Patient is calling to check on the status of this and states her insurance covers 2 30mg  tablets and does not cover 20 mg with taking 3 tablets a day. Please advise.   [id number] [place] - [address] [facility] [address]  [address] [place] [address]  Phone: [phone] Fax: [phone]

## 2018-04-20 NOTE — Telephone Encounter (Signed)
Adderall refill Last OV: 04/03/18 Last Refill:03/20/18 #90 tabs no RF [facility] [address] [place] PA-C

## 2018-04-21 MED ORDER — AMPHETAMINE-DEXTROAMPHETAMINE 20 MG PO TABS: 20.0000 mg | ORAL_TABLET | Freq: Three times a day (TID) | ORAL | 0 refills | Status: DC

## 2018-04-21 NOTE — Telephone Encounter (Signed)
Rx sent electronically.  Meds ordered this encounter  Medications  . amphetamine-dextroamphetamine (ADDERALL) 20 MG tablet    Sig: Take 1 tablet (20 mg total) by mouth 3 (three) times daily.    Dispense:  90 tablet    Refill:  0

## 2018-05-14 ENCOUNTER — Other Ambulatory Visit: Payer: Self-pay | Admitting: Physician Assistant

## 2018-05-14 DIAGNOSIS — F988 Other specified behavioral and emotional disorders with onset usually occurring in childhood and adolescence: Secondary | ICD-10-CM

## 2018-05-14 NOTE — Telephone Encounter (Signed)
Copied from CRM #[id number]. Topic: Quick Communication - Rx Refill/Question >> May 14, [phone] PM Cipriano Bunker wrote: Medication:   amphetamine-dextroamphetamine (ADDERALL) 20 MG tablet  She is putting in ahead of time as it took a week last month.   Has the patient contacted their pharmacy? Yes.    (Agent: If no, request that the patient contact the pharmacy for the refill.) (Agent: If yes, when and what did the pharmacy advise?)  Preferred Pharmacy (with phone number or street name):   [id number] - [place] - [address] AT [facility] [address] [facility] [address] [place] [address] Phone: [phone] Fax: [phone]    Agent: Please be advised that RX refills may take up to 3 business days. We ask that you follow-up with your pharmacy.

## 2018-05-15 NOTE — Telephone Encounter (Signed)
Refill request for Adderall, last filled on 04/21/18 #90. She is putting in ahead of time as it took a week last month.   LOV:04/03/18 Chelle Jeffery,PA  [address]

## 2018-05-16 MED ORDER — AMPHETAMINE-DEXTROAMPHETAMINE 20 MG PO TABS: 20.0000 mg | ORAL_TABLET | Freq: Three times a day (TID) | ORAL | 0 refills | Status: AC

## 2018-05-16 NOTE — Telephone Encounter (Signed)
Patient was to follow-up with me this month.  I have sent 3 Rxs to her pharmacy. One for now, one for next month, and one for July. She will need to see me for the next prescription, as I will not be able to prescribe for her once I leave here, until I have seen her at [facility], presuming that she intends to follow me there.  Meds ordered this encounter  Medications  . amphetamine-dextroamphetamine (ADDERALL) 20 MG tablet    Sig: Take 1 tablet (20 mg total) by mouth 3 (three) times daily.    Dispense:  90 tablet    Refill:  0  . amphetamine-dextroamphetamine (ADDERALL) 20 MG tablet    Sig: Take 1 tablet (20 mg total) by mouth 3 (three) times daily.    Dispense:  90 tablet    Refill:  0    Order Specific Question:   Supervising Provider    Answer:   SHAW, EVA N [[id number]]  . amphetamine-dextroamphetamine (ADDERALL) 20 MG tablet    Sig: Take 1 tablet (20 mg total) by mouth 3 (three) times daily.    Dispense:  90 tablet    Refill:  0    Order Specific Question:   Supervising Provider    Answer:   Clelia Croft, EVA N [[id number]]

## 2018-08-23 ENCOUNTER — Other Ambulatory Visit: Payer: Self-pay | Admitting: Family Medicine

## 2018-08-23 NOTE — Telephone Encounter (Signed)
Copied from CRM [id number]. Topic: Quick Communication - Rx Refill/Question >> Aug 23, 2018  1:21 PM Arlyss Gandyichardson, Adhvik Canady N, NT wrote: Medication: amphetamine-dextroamphetamine (ADDERALL) 20 MG tablet  Has the patient contacted their pharmacy? Yes.   (Agent: If no, request that the patient contact the pharmacy for the refill.) (Agent: If yes, when and what did the pharmacy advise?)  Preferred Pharmacy (with phone number or street name): [id number] [place] - [address] [facility] [address] [phone] (Phone) [phone] (Fax)  Pt has an appt with The[facility]helle [facility] at [facility] on 09[facility]06/18, but needs a refill before then if possible.   Agent: Please be advised that RX refills may take up to 3 business days. We ask that you follow-up with your pharmacy.

## 2018-08-30 NOTE — Telephone Encounter (Signed)
Left message on voicemail per ROI advising pt will not be able to refill Adderall 20 mg.  And since pt has appt with Chelle on 09/06/18 she should have Chelle refill this medication.  Advised to c/b with any further questions or concerns. Dgaddy, CMA

## 2018-09-20 DIAGNOSIS — F17200 Nicotine dependence, unspecified, uncomplicated: Secondary | ICD-10-CM | POA: Diagnosis not present

## 2018-09-20 DIAGNOSIS — F988 Other specified behavioral and emotional disorders with onset usually occurring in childhood and adolescence: Secondary | ICD-10-CM | POA: Diagnosis not present

## 2018-09-20 DIAGNOSIS — F329 Major depressive disorder, single episode, unspecified: Secondary | ICD-10-CM | POA: Diagnosis not present

## 2018-09-20 DIAGNOSIS — Z9109 Other allergy status, other than to drugs and biological substances: Secondary | ICD-10-CM | POA: Diagnosis not present

## 2018-10-18 DIAGNOSIS — F329 Major depressive disorder, single episode, unspecified: Secondary | ICD-10-CM | POA: Diagnosis not present

## 2018-10-18 DIAGNOSIS — L989 Disorder of the skin and subcutaneous tissue, unspecified: Secondary | ICD-10-CM | POA: Diagnosis not present

## 2018-11-15 DIAGNOSIS — F988 Other specified behavioral and emotional disorders with onset usually occurring in childhood and adolescence: Secondary | ICD-10-CM | POA: Diagnosis not present

## 2018-11-15 DIAGNOSIS — F329 Major depressive disorder, single episode, unspecified: Secondary | ICD-10-CM | POA: Diagnosis not present

## 2018-11-15 DIAGNOSIS — L989 Disorder of the skin and subcutaneous tissue, unspecified: Secondary | ICD-10-CM | POA: Diagnosis not present

## 2019-02-14 DIAGNOSIS — F331 Major depressive disorder, recurrent, moderate: Secondary | ICD-10-CM | POA: Diagnosis not present

## 2019-02-14 DIAGNOSIS — F988 Other specified behavioral and emotional disorders with onset usually occurring in childhood and adolescence: Secondary | ICD-10-CM | POA: Diagnosis not present

## 2019-03-14 DIAGNOSIS — F331 Major depressive disorder, recurrent, moderate: Secondary | ICD-10-CM | POA: Diagnosis not present

## 2019-03-14 DIAGNOSIS — F17200 Nicotine dependence, unspecified, uncomplicated: Secondary | ICD-10-CM | POA: Diagnosis not present

## 2019-03-14 DIAGNOSIS — F988 Other specified behavioral and emotional disorders with onset usually occurring in childhood and adolescence: Secondary | ICD-10-CM | POA: Diagnosis not present

## 2019-03-14 DIAGNOSIS — Z9109 Other allergy status, other than to drugs and biological substances: Secondary | ICD-10-CM | POA: Diagnosis not present

## 2019-04-04 ENCOUNTER — Other Ambulatory Visit: Payer: Self-pay | Admitting: Physician Assistant

## 2019-04-04 DIAGNOSIS — N6310 Unspecified lump in the right breast, unspecified quadrant: Secondary | ICD-10-CM

## 2019-04-04 DIAGNOSIS — N644 Mastodynia: Principal | ICD-10-CM

## 2019-04-10 DIAGNOSIS — J019 Acute sinusitis, unspecified: Secondary | ICD-10-CM | POA: Diagnosis not present

## 2019-05-13 ENCOUNTER — Ambulatory Visit
Admission: RE | Admit: 2019-05-13 | Discharge: 2019-05-13 | Disposition: A | Discharge status: Home / Self Care | Payer: BLUE CROSS/BLUE SHIELD | Source: Ambulatory Visit | Attending: Physician Assistant | Admitting: Physician Assistant

## 2019-05-13 ENCOUNTER — Other Ambulatory Visit: Payer: Self-pay

## 2019-05-13 DIAGNOSIS — N6310 Unspecified lump in the right breast, unspecified quadrant: Secondary | ICD-10-CM

## 2019-05-13 DIAGNOSIS — N644 Mastodynia: Secondary | ICD-10-CM

## 2019-06-01 ENCOUNTER — Ambulatory Visit (INDEPENDENT_AMBULATORY_CARE_PROVIDER_SITE_OTHER)
Admission: RE | Admit: 2019-06-01 | Discharge: 2019-06-01 | Disposition: A | Discharge status: Home / Self Care | Payer: BC Managed Care – PPO | Source: Ambulatory Visit

## 2019-06-01 ENCOUNTER — Inpatient Hospital Stay: Admission: RE | Admit: 2019-06-01 | Payer: BLUE CROSS/BLUE SHIELD | Source: Ambulatory Visit

## 2019-06-01 DIAGNOSIS — K047 Periapical abscess without sinus: Secondary | ICD-10-CM

## 2019-06-01 MED ORDER — CLINDAMYCIN HCL 150 MG PO CAPS: 150.0000 mg | ORAL_CAPSULE | Freq: Four times a day (QID) | ORAL | 0 refills | Status: AC

## 2019-06-01 NOTE — ED Provider Notes (Signed)
Virtual Visit via Video Note:  [name]  initiated request for Telemedicine visit with [facility] team. I connected with Jacques Navy  on 06/01/2019 at 7:08 PM  for a synchronized telemedicine visit using a video enabled HIPPA compliant telemedicine application. I verified that I am speaking with Jacques Navy  using two identifiers. [place] Hall-Potvin, PA-C  was physically located in a [facility] site and KYNNEDI ZWEIG was located at a different location.   The limitations of evaluation and management by telemedicine as well as the availability of in-person appointments were discussed. Patient was informed that she  may incur a bill ( including co-pay) for this virtual visit encounter. KETRA DUCHESNE  expressed understanding and gave verbal consent to proceed with virtual visit.     History of Present Illness:Kim Freeman  is a 38 y.o. female right history of dental abscess presenting with right-sided dental pain and concern for abscess.  Patient states that she has had this issue in the past, has a dentist who was unavailable this weekend.  Patient has tried amoxicillin in the past, though "it did not work they ended up having to put me on clindamycin ".  Patient endorses pain is worse with mastication, swelling, facial pain.  Patient denies fever, change in vision, ear pain, nasal congestion, odynophagia, choking.   Past Medical History:  Diagnosis Date  . ADHD   . ADHD   . Asthma    exercise, bronchitis induced; not on inhaler currently  . Broken arm   . Broken ribs   . Fracture, hip ([place])   . Late prenatal care   . SVD (spontaneous vaginal delivery) 07/04/2017  . SVD (spontaneous vaginal delivery) 07/04/2017  . Vaginal Pap smear, abnormal     Allergies  Allergen Reactions  . Fentanyl Itching        Observations/Objective: Developed, well-nourished female in no acute distress.  Oropharynx difficult to appreciate via camera.  Patient able to  point to area of concern (right upper tooth/canine)  Assessment and Plan: 38 year old female with history of dental abscess presenting for dental pain.  Does not appear to have full abscess at this time, though likely dental infection.  Will write for antibiotics (clindamycin as opposed to amoxicillin due to patient's reported history).  Follow Up Instructions: To follow-up with her dentist if symptoms do not resolve/worsen.    I discussed the assessment and treatment plan with the patient. The patient was provided an opportunity to ask questions and all were answered. The patient agreed with the plan and demonstrated an understanding of the instructions.   The patient was advised to call back or seek an in-person evaluation if the symptoms worsen or if the condition fails to improve as anticipated.  I provided 15 minutes of non-face-to-face time during this encounter.    [place] Hall-Potvin, PA-C  06/01/2019 7:08 PM        Hall-Potvin, [place], [place] 06/01/19 1909

## 2019-06-20 DIAGNOSIS — F988 Other specified behavioral and emotional disorders with onset usually occurring in childhood and adolescence: Secondary | ICD-10-CM | POA: Diagnosis not present

## 2019-06-20 DIAGNOSIS — G4700 Insomnia, unspecified: Secondary | ICD-10-CM | POA: Diagnosis not present

## 2019-06-20 DIAGNOSIS — F331 Major depressive disorder, recurrent, moderate: Secondary | ICD-10-CM | POA: Diagnosis not present

## 2019-08-26 DIAGNOSIS — J069 Acute upper respiratory infection, unspecified: Secondary | ICD-10-CM | POA: Diagnosis not present

## 2019-09-21 IMAGING — MG DIGITAL DIAGNOSTIC BILATERAL MAMMOGRAM WITH TOMO AND CAD
8 of 14 series · 8 of 40 positions shown · non-contrast
Comparison: None

CLINICAL DATA: Patient describes painful lump within the
upper-outer quadrant of the RIGHT breast, with occasional
numbness.This is patient's baseline mammogram.

EXAM:
DIGITAL DIAGNOSTIC BILATERAL MAMMOGRAM WITH CAD AND TOMO
ULTRASOUND RIGHT BREAST

[L CC synth-2D]
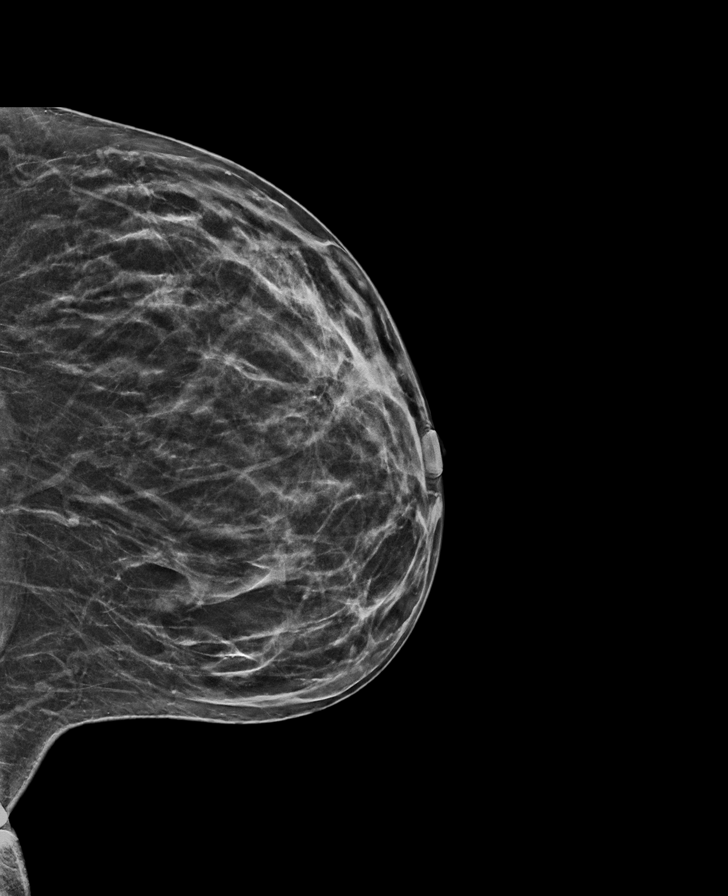

[R ML synth-2D]
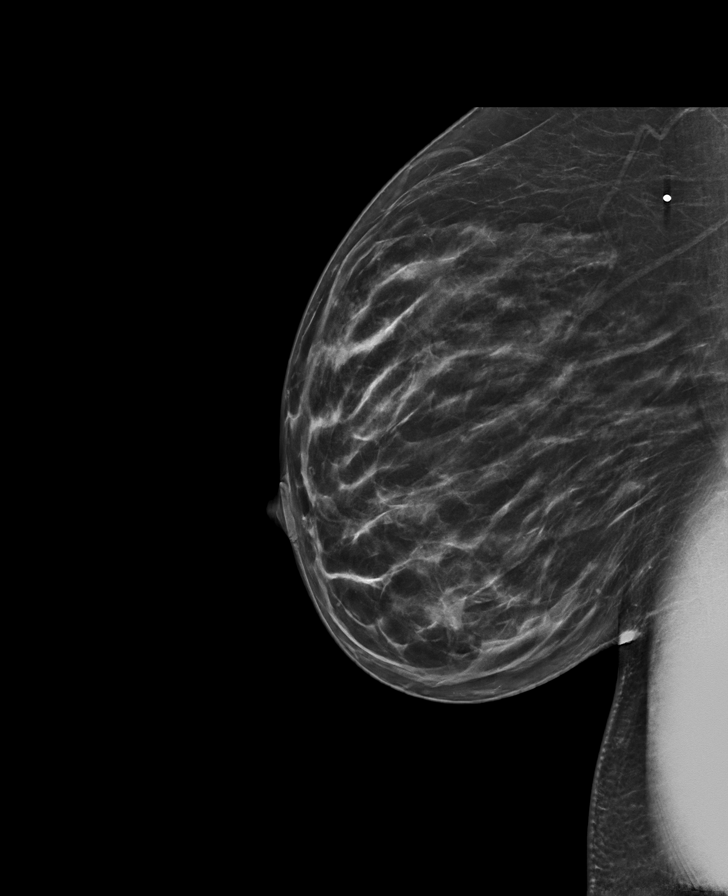

[R CC synth-2D (1 of 2)]
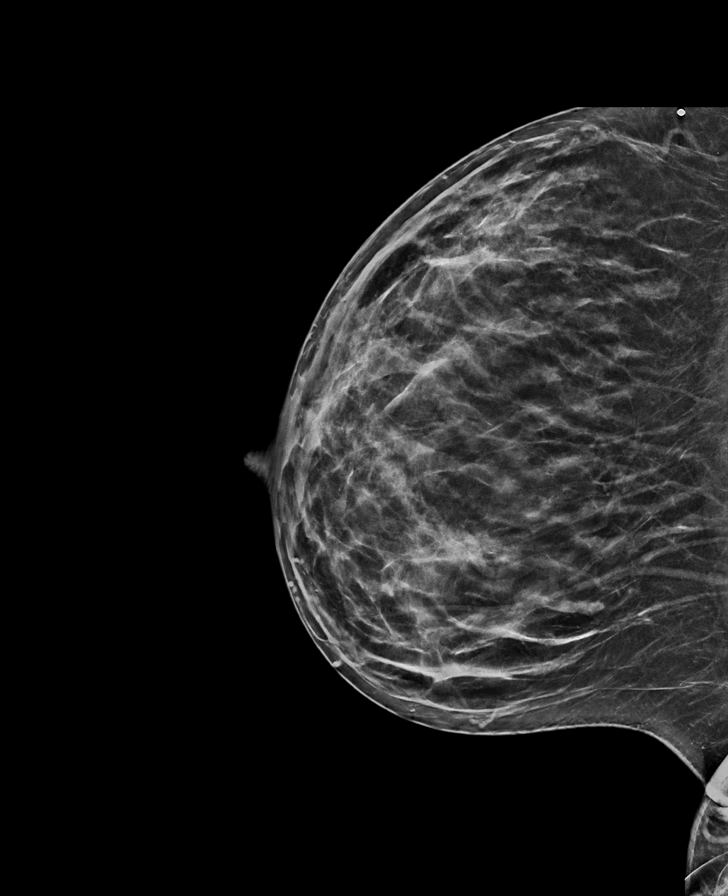

[L MLO synth-2D]
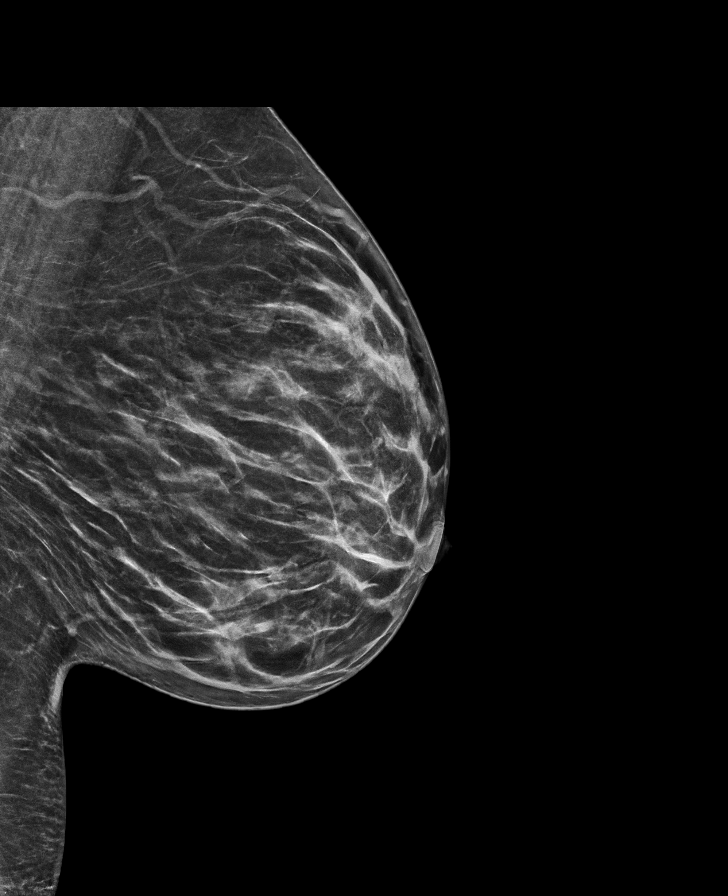

[R CC synth-2D (2 of 2)]
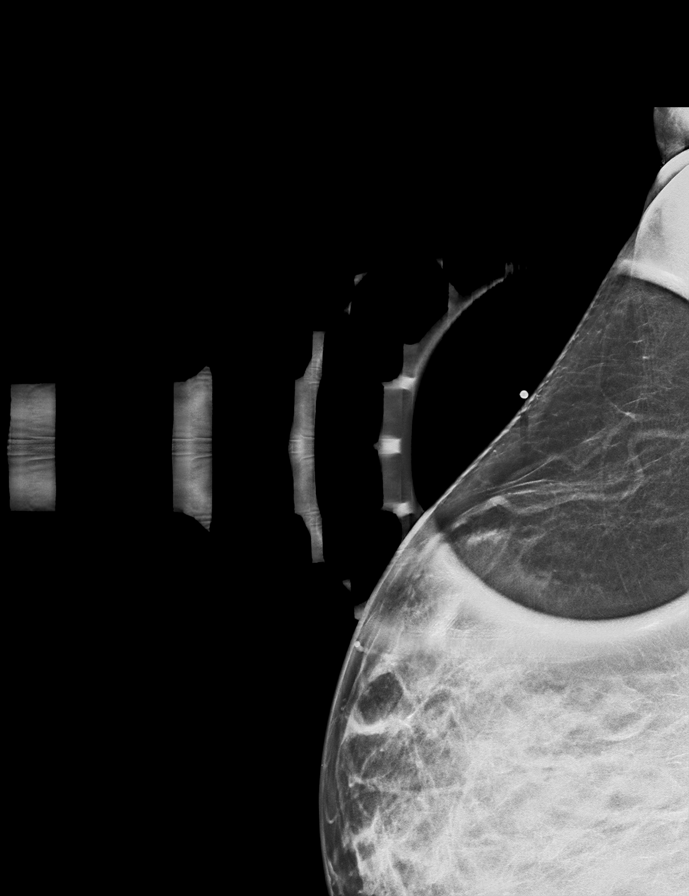

[R MLO synth-2D]
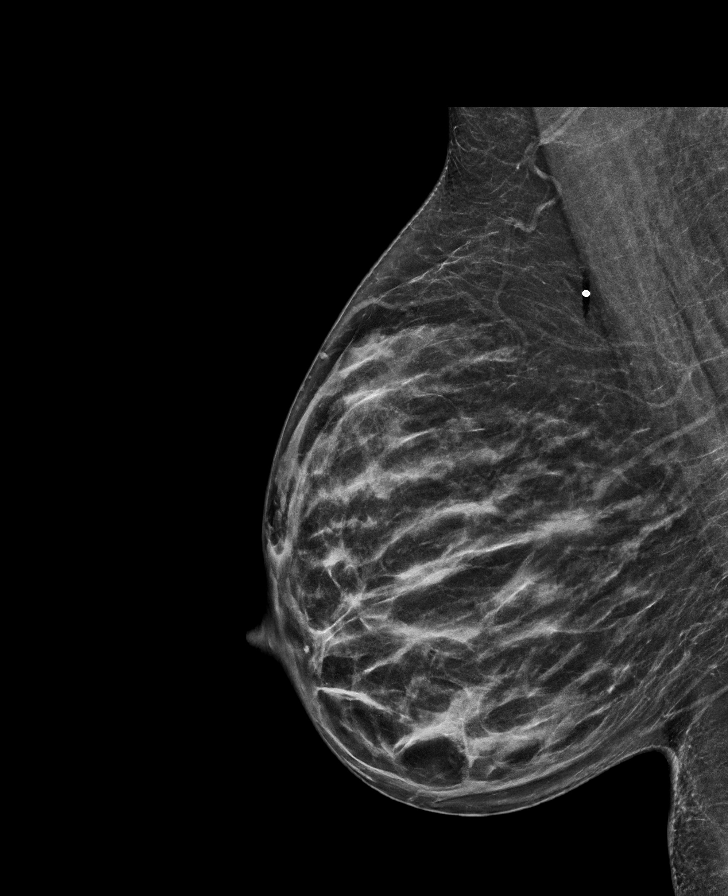

[R XCCL synth-2D]
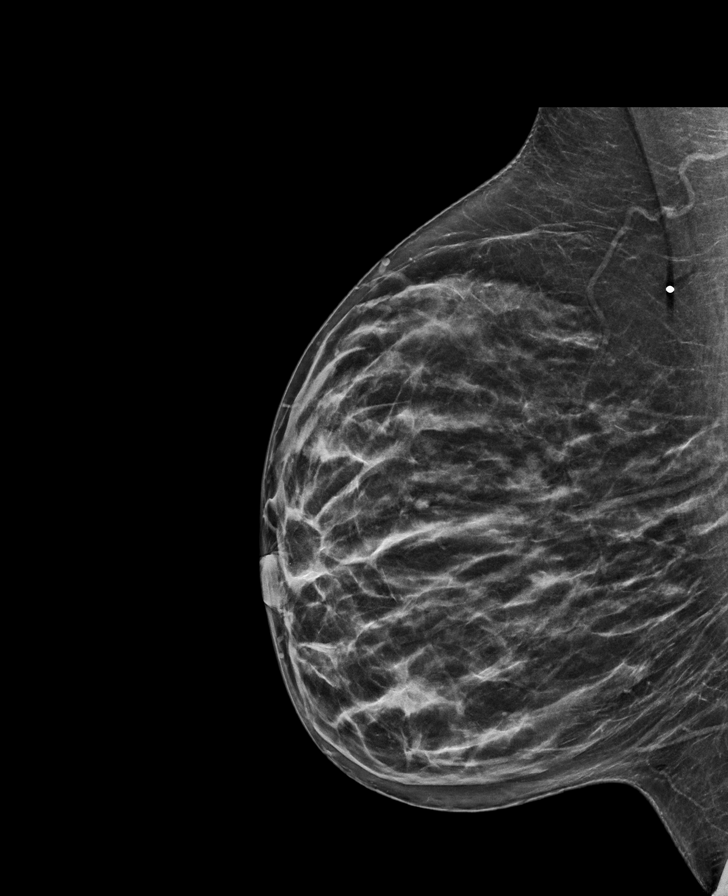

[R XCCL tomo · tomo slice 31/60.0]
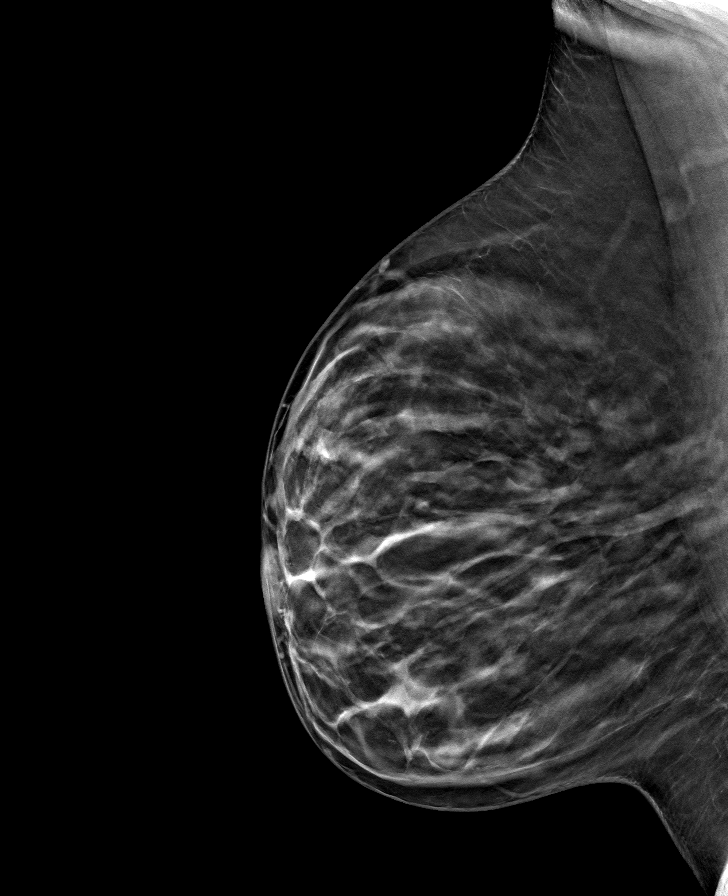

[8 of 40 positions shown; findings below may reference images not displayed]

ACR Breast Density Category c: The breast tissue is heterogeneously
dense, which may obscure small masses.
FINDINGS: RIGHT breast: There are no dominant masses, suspicious
calcifications or secondary signs of malignancy. Specifically, there
is no mammographic abnormality within the upper-outer quadrant the
RIGHT breast corresponding to the area of clinical concern, with
overlying skin marker in place.

LEFT breast: There are no dominant masses, suspicious calcifications
or secondary signs of malignancy within the LEFT breast.

Mammographic images were processed with CAD.

Targeted ultrasound is performed, evaluating the upper-outer
quadrant of the RIGHT breast as directed by the patient, showing
only normal fibroglandular tissues and fat lobules throughout. No
solid or cystic mass. No enlarged lymph nodes.
IMPRESSION: No evidence of malignancy within either breast. Specifically, no
evidence of malignancy within the upper-outer quadrant of the RIGHT
breast corresponding to the area of clinical concern.

RECOMMENDATION:
1. Screening mammogram at age 42 unless there are persistent or
intervening clinical concerns. (Code:YY-C-SG6)
2. The patient was instructed to return sooner if the area that she
feels becomes larger and/or firmer to palpation, or if a new
palpable abnormality is identified in either breast.

I have discussed the findings and recommendations with the patient.
Results were also provided in writing at the conclusion of the
visit. If applicable, a reminder letter will be sent to the patient
regarding the next appointment.

BI-RADS CATEGORY  1: Negative.

## 2021-05-13 ENCOUNTER — Encounter: Payer: Self-pay | Admitting: *Deleted

## 2021-06-01 ENCOUNTER — Encounter: Payer: BC Managed Care – PPO | Admitting: Obstetrics and Gynecology
